# Patient Record
Sex: Female | Born: 2000 | Race: White | Hispanic: No | Marital: Married | Smoking: Never smoker
Health system: Southern US, Community
[De-identification: ages and names within clinical notes are randomized; demographics above are authoritative.]

## PROBLEM LIST (undated history)

## (undated) ENCOUNTER — Inpatient Hospital Stay: Payer: Self-pay

## (undated) DIAGNOSIS — J45909 Unspecified asthma, uncomplicated: Secondary | ICD-10-CM

## (undated) DIAGNOSIS — R51 Headache: Secondary | ICD-10-CM

## (undated) DIAGNOSIS — R519 Headache, unspecified: Secondary | ICD-10-CM

## (undated) HISTORY — PX: TONSILLECTOMY: SUR1361

---
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
## 2019-09-09 ENCOUNTER — Other Ambulatory Visit: Payer: Self-pay

## 2019-09-09 ENCOUNTER — Emergency Department
Admission: EM | Admit: 2019-09-09 | Discharge: 2019-09-09 | Disposition: A | Discharge status: Home / Self Care | Payer: Medicaid Other | Attending: Emergency Medicine | Admitting: Emergency Medicine

## 2019-09-09 ENCOUNTER — Encounter: Payer: Self-pay | Admitting: Emergency Medicine

## 2019-09-09 DIAGNOSIS — R21 Rash and other nonspecific skin eruption: Secondary | ICD-10-CM | POA: Diagnosis present

## 2019-09-09 DIAGNOSIS — L309 Dermatitis, unspecified: Secondary | ICD-10-CM | POA: Diagnosis not present

## 2019-09-09 MED ORDER — EUCERIN EX CREA: TOPICAL_CREAM | CUTANEOUS | 0 refills | Status: DC | PRN

## 2019-09-09 NOTE — ED Provider Notes (Signed)
[facility] Emergency Department Provider Note  ____________________________________________  Time seen: Approximately 10:27 PM  I have reviewed the triage vital signs and the nursing notes.   HISTORY  Chief Complaint Rash    HPI Diane Watson is a 19 y.o. female who presents the emergency department complaining of dry burning rash to the dorsal aspect of the right hand.  She states that it is a dry cracking type rash.  Patient tried using lotion but states that it burns when she applies lotion.  She states that she has been washing her hands and using hand sanitizer at work quite frequently.  No history of recurring skin lesions.  Area is localized to the dorsal aspect of both hands.  No palmar involvement.  Patient has no other symptoms of fevers or chills, URI complaints.  No lesions inside the mouth.  No lesions to the foot.         Past Medical History:  Diagnosis Date  . Asthma   . Headache    Migraines since age 36    Patient Active Problem List   Diagnosis Date Noted  . Hypertension, postpartum condition or complication 08/13/2018  . Encounter for induction of labor 08/08/2018  . Failure to progress in labor, delivered, current hospitalization 08/08/2018  . Delivery of pregnancy by cesarean section 08/08/2018  . Postpartum care following cesarean delivery 08/08/2018  . Severe preeclampsia 08/05/2018  . Headache in pregnancy, antepartum, third trimester 07/07/2018  . Encounter for supervision of normal pregnancy in teen primigravida, antepartum 03/27/2018  . Migraine with aura and without status migrainosus, not intractable 03/20/2018    Past Surgical History:  Procedure Laterality Date  . ADENOIDECTOMY  12/09/2014   Procedure: ADENOIDECTOMY;  Surgeon: Geanie Logan, MD;  Location: [facility];  Service: ENT;;  . CESAREAN SECTION N/A 08/06/2018   Procedure: CESAREAN SECTION;  Surgeon: Conard Novak, MD;  Location: [facility];   Service: Obstetrics;  Laterality: N/A;  . TONSILLECTOMY N/A 12/09/2014   Procedure: TONSILLECTOMY;  Surgeon: Geanie Logan, MD;  Location: [facility];  Service: ENT;  Laterality: N/A;  . TONSILLECTOMY      Prior to Admission medications   Medication Sig Start Date End Date Taking? Authorizing Provider  albuterol (PROVENTIL HFA;VENTOLIN HFA) 108 (90 BASE) MCG/ACT inhaler Inhale into the lungs every 6 (six) hours as needed for wheezing or shortness of breath.    [provider]  butalbital-acetaminophen-caffeine (FIORICET, ESGIC) [phone] MG tablet Take 2 tablets by mouth every 6 (six) hours as needed for headache. 07/07/18   Tresea Mall, CNM  cetirizine (ZYRTEC) 10 MG tablet Take 10 mg by mouth daily. AM    [provider]  ibuprofen (ADVIL,MOTRIN) 800 MG tablet Take 1 tablet (800 mg total) by mouth every 8 (eight) hours. 08/08/18   Farrel Conners, CNM  labetalol (NORMODYNE) 200 MG tablet Take 1 tablet (200 mg total) by mouth 2 (two) times daily. 08/13/18   Nadara Mustard, MD  montelukast (SINGULAIR) 5 MG chewable tablet Chew 5 mg by mouth daily. AM    [provider]  Prenatal Vit-Fe Fumarate-FA (PRENATAL VITAMINS) 28-0.8 MG TABS Take 1 Units by mouth daily. 06/05/18   Oswaldo Conroy, CNM  Skin Protectants, Misc. (EUCERIN) cream Apply topically as needed for dry skin. 09/09/19   Echo Propp, Delorise Royals, PA-C    Allergies Latex, Other, and Pineapple  Family History  Problem Relation Age of Onset  . Diabetes Father  TYPE 1  . Hypertension Father   . Stroke Father   . Migraines Paternal Grandmother     Social History Social History   Tobacco Use  . Smoking status: Never Smoker  . Smokeless tobacco: Never Used  Substance Use Topics  . Alcohol use: No  . Drug use: Never     Review of Systems  Constitutional: No fever/chills Eyes: No visual changes. No discharge ENT: No upper respiratory complaints. Cardiovascular: no chest  pain. Respiratory: no cough. No SOB. Gastrointestinal: No abdominal pain.  No nausea, no vomiting.  No diarrhea.  No constipation. Musculoskeletal: Negative for musculoskeletal pain. Skin: Dry scaly rash to the dorsal aspect of both hands Neurological: Negative for headaches, focal weakness or numbness. 10-point ROS otherwise negative.  ____________________________________________   PHYSICAL EXAM:  VITAL SIGNS: ED Triage Vitals  Enc Vitals Group     BP 09/09/19 2216 (!) 141/85     Pulse Rate 09/09/19 2216 96     Resp 09/09/19 2216 20     Temp 09/09/19 2216 98.5 F (36.9 C)     Temp Source 09/09/19 2216 Oral     SpO2 09/09/19 2216 100 %     Weight 09/09/19 2214 240 lb (108.9 kg)     Height 09/09/19 2214 5\' 4"  (1.626 m)     Head Circumference --      Peak Flow --      Pain Score 09/09/19 2214 6     Pain Loc --      Pain Edu? --      Excl. in GC? --      Constitutional: Alert and oriented. Well appearing and in no acute distress. Eyes: Conjunctivae are normal. PERRL. EOMI. Head: Atraumatic. ENT:      Ears:       Nose: No congestion/rhinnorhea.      Mouth/Throat: Mucous membranes are moist.  No intraoral lesions.  No erythema or edema of the oropharynx. Neck: No stridor.    Cardiovascular: Normal rate, regular rhythm. Normal S1 and S2.  Good peripheral circulation. Respiratory: Normal respiratory effort without tachypnea or retractions. Lungs CTAB. Good air entry to the bases with no decreased or absent breath sounds. Musculoskeletal: Full range of motion to all extremities. No gross deformities appreciated. Neurologic:  Normal speech and language. No gross focal neurologic deficits are appreciated.  Skin:  Skin is warm, dry and intact.  Visualization of the bilateral dorsal hands reveals a scaly, dry, slightly erythematous rash consistent with dyshidrotic eczema.  No evidence of cellulitis.  No evidence of fungal infection.  No lesions to the palms, soles of the  foot. Psychiatric: Mood and affect are normal. Speech and behavior are normal. Patient exhibits appropriate insight and judgement.   ____________________________________________   LABS (all labs ordered are listed, but only abnormal results are displayed)  Labs Reviewed - No data to display ____________________________________________  EKG   ____________________________________________  RADIOLOGY   No results found.  ____________________________________________    PROCEDURES  Procedure(s) performed:    Procedures    Medications - No data to display   ____________________________________________   INITIAL IMPRESSION / ASSESSMENT AND PLAN / ED COURSE  Pertinent labs & imaging results that were available during my care of the patient were reviewed by me and considered in my medical decision making (see chart for details).  Review of the [place] CSRS was performed in accordance of the [facility] prior to dispensing any controlled drugs.  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  Patient presented to emergency department complaining of a dry rash to the dorsal aspect of bilateral hands.  Patient states that she washes her hands frequently using both alcohol-based hand sanitizer as well as soap and water.  On exam findings appear most consistent with dyshidrotic eczema.  Differential included scarlatina, viral exanthem, hand-foot-and-mouth disease, syphilis, atopic dermatitis, allergic dermatitis.  No new foods, medications, topicals.  Patient has no viral URI symptoms.  No lesions elsewhere.  Rash does not have the appearance of a syphilis type rash.  Appearance, symptoms, presentation and location are most consistent with dyshidrotic eczema.. Patient will be discharged home with prescriptions for Eucerin. Patient is to follow up with primary care as needed or otherwise directed. Patient is given ED precautions to return to the ED for any worsening or  new symptoms.     ____________________________________________  FINAL CLINICAL IMPRESSION(S) / ED DIAGNOSES  Final diagnoses:  Eczema, unspecified type      NEW MEDICATIONS STARTED DURING THIS VISIT:  ED Discharge Orders         Ordered    Skin Protectants, Misc. (EUCERIN) cream  As needed     09/09/19 2244              This chart was dictated using voice recognition software/Dragon. Despite best efforts to proofread, errors can occur which can change the meaning. Any change was purely unintentional.    Darletta Moll, PA-C 09/09/19 2316    Nance Pear, MD 09/09/19 2317

## 2019-09-09 NOTE — ED Triage Notes (Signed)
Pt presents to ED with red dry burning rash to her hands about a week ago. No improvement with lotion and increasing hand washing. Redness noted at this time.

## 2022-03-18 ENCOUNTER — Emergency Department: Payer: Medicaid Other

## 2022-03-18 ENCOUNTER — Emergency Department
Admission: EM | Admit: 2022-03-18 | Discharge: 2022-03-18 | Disposition: A | Discharge status: Home / Self Care | Payer: Medicaid Other | Attending: Emergency Medicine | Admitting: Emergency Medicine

## 2022-03-18 ENCOUNTER — Other Ambulatory Visit: Payer: Self-pay

## 2022-03-18 ENCOUNTER — Encounter: Payer: Self-pay | Admitting: Emergency Medicine

## 2022-03-18 DIAGNOSIS — R0609 Other forms of dyspnea: Secondary | ICD-10-CM | POA: Insufficient documentation

## 2022-03-18 DIAGNOSIS — D72829 Elevated white blood cell count, unspecified: Secondary | ICD-10-CM | POA: Diagnosis not present

## 2022-03-18 DIAGNOSIS — R55 Syncope and collapse: Secondary | ICD-10-CM | POA: Diagnosis present

## 2022-03-18 LAB — CBG MONITORING, ED: Glucose-Capillary: 97 mg/dL (ref 70–99)

## 2022-03-18 LAB — POC URINE PREG, ED: Preg Test, Ur: NEGATIVE

## 2022-03-18 LAB — URINALYSIS, ROUTINE W REFLEX MICROSCOPIC
Glucose, UA: NEGATIVE mg/dL
Hgb urine dipstick: NEGATIVE
Leukocytes,Ua: NEGATIVE
Nitrite: NEGATIVE
Protein, ur: 100 mg/dL — AB
Specific Gravity, Urine: 1.02 (ref 1.005–1.030)
pH: 7.5 (ref 5.0–8.0)

## 2022-03-18 LAB — HEPATIC FUNCTION PANEL
ALT: 12 U/L (ref 0–44)
AST: 21 U/L (ref 15–41)
Albumin: 4.6 g/dL (ref 3.5–5.0)
Alkaline Phosphatase: 64 U/L (ref 38–126)
Bilirubin, Direct: 0.2 mg/dL (ref 0.0–0.2)
Indirect Bilirubin: 0.7 mg/dL (ref 0.3–0.9)
Total Bilirubin: 0.9 mg/dL (ref 0.3–1.2)
Total Protein: 7.5 g/dL (ref 6.5–8.1)

## 2022-03-18 LAB — BASIC METABOLIC PANEL
Anion gap: 6 (ref 5–15)
BUN: 10 mg/dL (ref 6–20)
CO2: 26 mmol/L (ref 22–32)
Calcium: 9.2 mg/dL (ref 8.9–10.3)
Chloride: 107 mmol/L (ref 98–111)
Creatinine, Ser: 0.88 mg/dL (ref 0.44–1.00)
GFR, Estimated: >60 mL/min (ref >60)
Glucose, Bld: 103 mg/dL — ABNORMAL HIGH (ref 70–99)
Potassium: 3.7 mmol/L (ref 3.5–5.1)
Sodium: 139 mmol/L (ref 135–145)

## 2022-03-18 LAB — CBC
HCT: 44.3 % (ref 36.0–46.0)
Hemoglobin: 15.1 g/dL — ABNORMAL HIGH (ref 12.0–15.0)
MCH: 30.5 pg (ref 26.0–34.0)
MCHC: 34.1 g/dL (ref 30.0–36.0)
MCV: 89.5 fL (ref 80.0–100.0)
Platelets: 256 10*3/uL (ref 150–400)
RBC: 4.95 MIL/uL (ref 3.87–5.11)
RDW: 12.3 % (ref 11.5–15.5)
WBC: 11.3 10*3/uL — ABNORMAL HIGH (ref 4.0–10.5)
nRBC: 0 % (ref 0.0–0.2)

## 2022-03-18 LAB — URINALYSIS, MICROSCOPIC (REFLEX)

## 2022-03-18 NOTE — Discharge Instructions (Addendum)
I am not sure why you passed out.  Please return if you have any further problems.  I would like you to follow-up with primary care.  You could try the [facility] clinic or the [facility] or the [facility] or [facility] or [facility].  They will take people with insurance.  Clinics that do not take insurance which may take you with Medicaid but they may not either, include the [facility] and the [facility] in the [facility].  [facility] may take you or may just have you follow-up with their primary care people.

## 2022-03-18 NOTE — ED Triage Notes (Signed)
Pt presents to ER accompanied by husband who reports he found pt on the floor, pt loss consciousness reports she does not recall passing out, unsure if she hit her head or not. Pt talks in complete sentences no distress noted

## 2022-03-18 NOTE — ED Provider Notes (Signed)
[facility] Provider Note    Event Date/Time   First MD Initiated Contact with Patient 03/18/22 2057     (approximate)   History   Loss of Consciousness   HPI  Junella Domke is a 21 y.o. female who comes in with her husband.  They report she was sitting and she says she got dizzy everything began spinning and very quickly she just passed out.  She slumped over to the side per her husband.  She did not hit the floor.  He heard some heavy breathing came and found her stood her up and she woke up.  She has never done this before.  She has no allergies to medicines.  She has no medical problems.  She takes no medicines.  Review of her old records shows she has migraines without aura and had severe preeclampsia and a C-section.  Old records also mention hypertension postpartum.   Husband reports she when she is woke up when he stood her up she was confused for few seconds only and then woke up normally.  Again only for a few seconds.   Physical Exam   Triage Vital Signs: ED Triage Vitals  Enc Vitals Group     BP 03/18/22 1956 136/84     Pulse Rate 03/18/22 1956 69     Resp 03/18/22 1956 16     Temp 03/18/22 1956 99 F (37.2 C)     Temp Source 03/18/22 1956 Oral     SpO2 03/18/22 1956 99 %     Weight 03/18/22 1957 181 lb (82.1 kg)     Height 03/18/22 1957 5\' 2"  (1.575 m)     Head Circumference --      Peak Flow --      Pain Score 03/18/22 1957 6     Pain Loc --      Pain Edu? --      Excl. in GC? --     Most recent vital signs: Vitals:   03/18/22 2200 03/18/22 2215  BP:  128/70  Pulse: (!) 37 78  Resp: 17 13  Temp:    SpO2: 98% 98%     General: Awake, no distress.  Head normocephalic atraumatic CV:  Good peripheral perfusion.  Heart regular rate and rhythm no audible murmurs Resp:  Normal effort.  Lungs are clear Abd:  No distention.  Soft and nontender Extremities: No edema    ED Results / Procedures / Treatments   Labs (all  labs ordered are listed, but only abnormal results are displayed) Labs Reviewed  BASIC METABOLIC PANEL - Abnormal; Notable for the following components:      Result Value   Glucose, Bld 103 (*)    All other components within normal limits  CBC - Abnormal; Notable for the following components:   WBC 11.3 (*)    Hemoglobin 15.1 (*)    All other components within normal limits  URINALYSIS, ROUTINE W REFLEX MICROSCOPIC - Abnormal; Notable for the following components:   Bilirubin Urine SMALL (*)    Ketones, ur TRACE (*)    Protein, ur 100 (*)    All other components within normal limits  URINALYSIS, MICROSCOPIC (REFLEX) - Abnormal; Notable for the following components:   Bacteria, UA RARE (*)    All other components within normal limits  HEPATIC FUNCTION PANEL  CBG MONITORING, ED  POC URINE PREG, ED     EKG  EKG read interpreted by me shows normal sinus rhythm rate  of 69 normal axis no acute ST-T changes   RADIOLOGY CT of the head read by Rady allergy reviewed and interpreted by me shows no acute changes   PROCEDURES:  Critical Care performed:   Procedures   MEDICATIONS ORDERED IN ED: Medications - No data to display   IMPRESSION / MDM / [place] / ED COURSE  I reviewed the triage vital signs and the nursing notes. ----------------------------------------- 10:24 PM on 03/18/2022 ----------------------------------------- Patient had only very brief episode of vertigo before she passed out.  She did not have any confusion like postictal state when she woke up she was not out for very long.  EKG CT pregnancy test are all negative urine does not show any acute pathology there is some protein in the urine but that said.  Her white count is minimally elevated at 11.3 normal electrolytes LFTs etc. I cannot find any obvious cause for what happened to her.  It is possible she just fainted for some reason.  I will have her follow-up with primary care and return for any  further problems or symptoms.  I discussed this in detail with her.  She has Medicaid.  So I will give her both a list of the clinics that take insurance and the clinics that do not.  Differential diagnosis includes, but is not limited to, syncope or seizure or TIA are all possible and also very unlikely except for the syncope.  Patient's presentation is most consistent with acute complicated illness / injury requiring diagnostic workup.     FINAL CLINICAL IMPRESSION(S) / ED DIAGNOSES   Final diagnoses:  Syncope and collapse     Rx / DC Orders   ED Discharge Orders     None        Note:  This document was prepared using Dragon voice recognition software and may include unintentional dictation errors.   Nena Polio, MD 03/18/22 2228

## 2022-03-18 NOTE — ED Notes (Signed)
Pt received discharge papers, verbalizes understanding. Pt ambulates to private vehicle accompanied by husband.

## 2022-09-28 ENCOUNTER — Emergency Department
Admission: EM | Admit: 2022-09-28 | Discharge: 2022-09-28 | Disposition: A | Discharge status: Home / Self Care | Payer: Medicaid Other | Attending: Emergency Medicine | Admitting: Emergency Medicine

## 2022-09-28 ENCOUNTER — Emergency Department: Payer: Medicaid Other

## 2022-09-28 ENCOUNTER — Other Ambulatory Visit: Payer: Self-pay

## 2022-09-28 DIAGNOSIS — N939 Abnormal uterine and vaginal bleeding, unspecified: Secondary | ICD-10-CM | POA: Diagnosis not present

## 2022-09-28 LAB — URINALYSIS, ROUTINE W REFLEX MICROSCOPIC
Bacteria, UA: NONE SEEN
Bilirubin Urine: NEGATIVE
Glucose, UA: NEGATIVE mg/dL
Ketones, ur: NEGATIVE mg/dL
Nitrite: NEGATIVE
Protein, ur: NEGATIVE mg/dL
Specific Gravity, Urine: 1.004 — ABNORMAL LOW (ref 1.005–1.030)
pH: 8 (ref 5.0–8.0)

## 2022-09-28 LAB — CBC
HCT: 44.4 % (ref 36.0–46.0)
Hemoglobin: 15.2 g/dL — ABNORMAL HIGH (ref 12.0–15.0)
MCH: 31.3 pg (ref 26.0–34.0)
MCHC: 34.2 g/dL (ref 30.0–36.0)
MCV: 91.4 fL (ref 80.0–100.0)
Platelets: 244 10*3/uL (ref 150–400)
RBC: 4.86 MIL/uL (ref 3.87–5.11)
RDW: 12.8 % (ref 11.5–15.5)
WBC: 10.7 10*3/uL — ABNORMAL HIGH (ref 4.0–10.5)
nRBC: 0 % (ref 0.0–0.2)

## 2022-09-28 LAB — WET PREP, GENITAL
Clue Cells Wet Prep HPF POC: NONE SEEN
Sperm: NONE SEEN
Trich, Wet Prep: NONE SEEN
WBC, Wet Prep HPF POC: <10 (ref <10)
Yeast Wet Prep HPF POC: NONE SEEN

## 2022-09-28 LAB — POC URINE PREG, ED: Preg Test, Ur: NEGATIVE

## 2022-09-28 LAB — CHLAMYDIA/NGC RT PCR (ARMC ONLY)
Chlamydia Tr: NOT DETECTED
N gonorrhoeae: NOT DETECTED

## 2022-09-28 LAB — TSH: TSH: 0.946 u[IU]/mL (ref 0.350–4.500)

## 2022-09-28 MED ORDER — LEVONORGESTREL-ETHINYL ESTRAD 0.1-20 MG-MCG PO TABS: 1.0000 | ORAL_TABLET | Freq: Every day | ORAL | 2 refills | Status: DC

## 2022-09-28 NOTE — ED Triage Notes (Signed)
Pt states coming in with vaginal bleeding for 2 weeks. Pt states she started her menstrual cycle and it has not ended. Pt states pain to the genital area. Pt states she is having blood clots as well.

## 2022-09-28 NOTE — Discharge Instructions (Signed)
Your exam and labs overall reassuring at this time.  No signs of anemia or acute vaginitis.  Take the prescription birth control pill as directed.  Select and follow-up with a local GYN provider for ongoing evaluation.

## 2022-09-28 NOTE — ED Provider Notes (Signed)
[facility] Emergency Department Provider Note     Event Date/Time   First MD Initiated Contact with Patient 09/28/22 1348     (approximate)   History   Vaginal Bleeding   HPI  Diane Watson is a 22 y.o. female G1P1, to the ED with abnormal menstrual bleeding.  Patient reports vaginal bleeding started about 2 weeks ago, consistent with what she thought was her normal menstrual period.  She describes continuing vaginal bleeding with passage of large clots at this time.  She denies any history of ovarian cyst, endometriosis, fibroids, or any history of AUB.  Patient not on any current hormone therapy.  She any fevers, chills, or sweats.  She also denies any dysuria or hematuria.  Physical Exam   Triage Vital Signs: ED Triage Vitals  Enc Vitals Group     BP 09/28/22 1230 (!) 137/95     Pulse Rate 09/28/22 1230 81     Resp 09/28/22 1230 16     Temp 09/28/22 1230 98.4 F (36.9 C)     Temp Source 09/28/22 1230 Oral     SpO2 09/28/22 1230 100 %     Weight 09/28/22 1231 190 lb (86.2 kg)     Height 09/28/22 1231 5\' 2"  (1.575 m)     Head Circumference --      Peak Flow --      Pain Score 09/28/22 1230 8     Pain Loc --      Pain Edu? --      Excl. in [place]? --     Most recent vital signs: Vitals:   09/28/22 1230 09/28/22 1650  BP: (!) 137/95 128/76  Pulse: 81 70  Resp: 16 18  Temp: 98.4 F (36.9 C)   SpO2: 100% 100%    General Awake, no distress. NAD CV:  Good peripheral perfusion.  RESP:  Normal effort.  ABD:  No distention.  GU:  Normal external genitalia.  Scant dark blood noted in the vault.  Cervix is closed.  No CMT or adnexal masses appreciated.   ED Results / Procedures / Treatments   Labs (all labs ordered are listed, but only abnormal results are displayed) Labs Reviewed  CBC - Abnormal; Notable for the following components:      Result Value   WBC 10.7 (*)    Hemoglobin 15.2 (*)    All other components within normal  limits  URINALYSIS, ROUTINE W REFLEX MICROSCOPIC - Abnormal; Notable for the following components:   Color, Urine STRAW (*)    APPearance CLEAR (*)    Specific Gravity, Urine 1.004 (*)    Hgb urine dipstick LARGE (*)    Leukocytes,Ua TRACE (*)    All other components within normal limits  WET PREP, GENITAL  CHLAMYDIA/NGC RT PCR ([facility] ONLY)            TSH  POC URINE PREG, ED     EKG   RADIOLOGY  I personally viewed and evaluated these images as part of my medical decision making, as well as reviewing the written report by the radiologist.  ED Provider Interpretation: no acute findings  US PELVIC COMPLETE W TRANSVAGINAL AND TORSION R/O  Result Date: 09/28/2022 CLINICAL DATA:  Vaginal bleeding for 2 weeks. EXAM: TRANSABDOMINAL AND TRANSVAGINAL ULTRASOUND OF PELVIS DOPPLER ULTRASOUND OF OVARIES TECHNIQUE: Both transabdominal and transvaginal ultrasound examinations of the pelvis were performed. Transabdominal technique was performed for global imaging of the pelvis including uterus, ovaries, adnexal regions,  and pelvic cul-de-sac. It was necessary to proceed with endovaginal exam following the transabdominal exam to visualize the ovaries and endometrium. Color and duplex Doppler ultrasound was utilized to evaluate blood flow to the ovaries. COMPARISON:  None Available. FINDINGS: Uterus Measurements: 8.9 x 4.6 x 5.1 cm = volume: 109.5 mL. No fibroids or other mass visualized. Endometrium Thickness: 7.4 mm.  No endometrial mass, polyp or fluid collection. Right ovary Measurements: 3.1 x 2.4 x 2.7 cm = volume: 10.65 mL. Normal appearance/no adnexal mass. Left ovary Measurements: 2.4 x 1.5 x 2.1 cm = volume: 4.1 mL. Normal appearance/no adnexal mass. Pulsed Doppler evaluation of both ovaries demonstrates normal low-resistance arterial and venous waveforms. Other findings No abnormal free fluid. IMPRESSION: Normal pelvic ultrasound examination. Electronically Signed   By: Rudie Meyer M.D.   On:  09/28/2022 16:41     PROCEDURES:  Critical Care performed: No  Procedures   MEDICATIONS ORDERED IN ED: Medications - No data to display   IMPRESSION / MDM / ASSESSMENT AND PLAN / ED COURSE  I reviewed the triage vital signs and the nursing notes.                              Differential diagnosis includes, but is not limited to, ovarian cyst, ovarian torsion, acute appendicitis, diverticulitis, urinary tract infection/pyelonephritis, endometriosis, bowel obstruction, colitis, renal colic, gastroenteritis, hernia, fibroids, endometriosis, pregnancy related pain including ectopic pregnancy, etc.  Patient's presentation is most consistent with acute complicated illness / injury requiring diagnostic workup.   Patient's diagnosis is consistent with abnormal uterine bleeding.  Reassuring workup overall without signs of critical anemia acute leukocytosis.  No evidence of UTI, or abnormal ultrasound finding, based on my review and interpretation.  Patient will be discharged home with prescriptions for estrogen containing birth control pill. Patient is to follow up with [facility] as needed or otherwise directed. Patient is given ED precautions to return to the ED for any worsening or new symptoms.   FINAL CLINICAL IMPRESSION(S) / ED DIAGNOSES   Final diagnoses:  Abnormal uterine bleeding (AUB)     Rx / DC Orders   ED Discharge Orders          Ordered    levonorgestrel-ethinyl estradiol (ALESSE) 0.1-20 MG-MCG tablet  Daily        09/28/22 1713             Note:  This document was prepared using Dragon voice recognition software and may include unintentional dictation errors.    Lissa Hoard, PA-C 09/28/22 1811    Sharman Cheek, MD 09/30/22 0030

## 2023-01-26 ENCOUNTER — Ambulatory Visit: Payer: Medicaid Other

## 2023-01-26 VITALS — BP 129/87 | HR 88 | Ht 62.4 in | Wt 209.1 lb

## 2023-01-26 DIAGNOSIS — Z3202 Encounter for pregnancy test, result negative: Secondary | ICD-10-CM

## 2023-01-26 DIAGNOSIS — N912 Amenorrhea, unspecified: Secondary | ICD-10-CM | POA: Diagnosis not present

## 2023-01-26 LAB — POCT URINE PREGNANCY: Preg Test, Ur: NEGATIVE

## 2023-01-26 NOTE — Progress Notes (Signed)
   GYN ENCOUNTER  Encounter for pregnancy confirmation  Subjective  HPI: Diane Watson is a 22 y.o. G1P0101 who presents today for confirmation of pregnancy. Her LMP was 12/30/22. She reports having regular periods every 28-30 days except for this month. She has been actively trying to get pregnant for about 4 months. She took a [facility] pregnancy test about a week ago that was positive.   Past Medical History:  Diagnosis Date   Asthma    Encounter for supervision of normal pregnancy in teen primigravida, antepartum 03/27/2018   Clinic    [facility]    Prenatal Labs      Dating    17 w ultrasound    Blood type: O/Positive/-- (10/03 0959)       Genetic Screen    NIPS: Negative XY    Antibody:Negative (10/03 0959)      Anatomic [place]    Complete 05/09/2018    Rubella: 5.98 (10/03 0959) Varicella: Non-immune      GTT    Early: 119             Third trimester: 133    RPR: Non Reactive (10/03 0959)       Rhogam    N/A    HBsAg: Neg   Failure to progress in labor, delivered, current hospitalization 08/08/2018   Headache    Migraines since age 82   Hypertension, postpartum condition or complication 08/13/2018   Past Surgical History:  Procedure Laterality Date   ADENOIDECTOMY  12/09/2014   Procedure: ADENOIDECTOMY;  Surgeon: Geanie Logan, MD;  Location: [facility];  Service: ENT;;   CESAREAN SECTION N/A 08/06/2018   Procedure: CESAREAN SECTION;  Surgeon: Conard Novak, MD;  Location: [facility] OR[facility];  Service: Obstetrics;  Laterality: N/A;   TONSILLECTOMY N/A 12/09/2014   Procedure: TONSILLECTOMY;  Surgeon: Geanie Logan, MD;  Location: [facility];  Service: ENT;  Laterality: N/A;   TONSILLECTOMY     OB History     Gravida  1   Para  1   Term      Preterm  1   AB      Living  1      SAB      IAB      Ectopic      Multiple  0   Live Births  1          Allergies  Allergen Reactions   Latex    Other    Pineapple     Numbness in mouth     Review  of Systems  12 point ROS negative except for pertinent positives noted in HPO above.   Objective  BP 129/87   Pulse 88   Ht 5' 2.4" (1.585 m)   Wt 209 lb 1.6 oz (94.8 kg)   LMP 12/30/2022 (Approximate)   BMI 37.76 kg/m   Physical examination GENERAL APPEARANCE: alert, well appearing    Assessment - Negative urine pregnancy test in office today. - Possible early pregnancy or late menstrual cycle.  Plan - bHcg ordered today, will repeat after weekend to confirm levels are rising appropriately. - Counseled to make NOB and ultrasound appointment if lab work confirms pregnancy.   [name], CNM  01/26/23 9:39 AM

## 2023-01-30 ENCOUNTER — Other Ambulatory Visit: Payer: Medicaid Other

## 2023-02-01 ENCOUNTER — Other Ambulatory Visit: Payer: Medicaid Other

## 2023-02-02 ENCOUNTER — Telehealth: Payer: Self-pay

## 2023-02-02 NOTE — Telephone Encounter (Signed)
Reached out to pt to reschedule lab appt that was scheduled on 02/01/2023 at 11:00.  Left message for pt to call back to reschedule.

## 2023-02-06 NOTE — Telephone Encounter (Signed)
Reached out to pt (2x) to reschedule lab appt that was scheuduled on 02/01/2023 at 11:00.  Left message for pt to call back to reschedule.   Will send a MyChart letter.

## 2023-10-19 ENCOUNTER — Ambulatory Visit

## 2023-10-19 VITALS — BP 130/83 | HR 91 | Wt 218.0 lb

## 2023-10-19 DIAGNOSIS — Z3201 Encounter for pregnancy test, result positive: Secondary | ICD-10-CM | POA: Diagnosis not present

## 2023-10-19 DIAGNOSIS — N912 Amenorrhea, unspecified: Secondary | ICD-10-CM

## 2023-10-19 LAB — POCT URINE PREGNANCY: Preg Test, Ur: POSITIVE — AB

## 2023-10-19 NOTE — Progress Notes (Signed)
    NURSE VISIT NOTE  Subjective:    Patient ID: Maisley Hainsworth, female    DOB: 08/03/2000, 23 y.o.   MRN: [id number]  HPI  Patient is a 23 y.o. G66P0101 female who presents for evaluation of amenorrhea. She believes she could be pregnant. Pregnancy is desired. Sexual Activity: single partner, contraception: none. Current symptoms also include: breast tenderness, fatigue, and positive home pregnancy test. Last period was normal.    Objective:    BP 130/83 (BP Location: Right Arm, Patient Position: Sitting, Cuff Size: Large)   Pulse 91   Wt 218 lb (98.9 kg)   LMP 08/07/2023 (Approximate)   BMI 39.36 kg/m     Assessment:   1. Amenorrhea     Plan:   Pregnancy Test: Positive  Estimated Date of Delivery: 05/13/2024 BP Cuff Measurement taken. Cuff Size Adult Large     Cathyann Cobia, CMA

## 2023-10-23 ENCOUNTER — Telehealth (INDEPENDENT_AMBULATORY_CARE_PROVIDER_SITE_OTHER)

## 2023-10-23 VITALS — Ht 62.4 in | Wt 215.0 lb

## 2023-10-23 DIAGNOSIS — O0990 Supervision of high risk pregnancy, unspecified, unspecified trimester: Secondary | ICD-10-CM | POA: Insufficient documentation

## 2023-10-23 DIAGNOSIS — O0991 Supervision of high risk pregnancy, unspecified, first trimester: Secondary | ICD-10-CM

## 2023-10-23 DIAGNOSIS — Z98891 History of uterine scar from previous surgery: Secondary | ICD-10-CM

## 2023-10-23 DIAGNOSIS — Z8759 Personal history of other complications of pregnancy, childbirth and the puerperium: Secondary | ICD-10-CM

## 2023-10-23 MED ORDER — BLOOD PRESSURE KIT: PACK | 0 refills | Status: DC

## 2023-10-23 NOTE — Patient Instructions (Signed)
 Prenatal Classes offered through [facility] [email or web address]   Doula Website [email or web address]  First Trimester of Pregnancy  The first trimester of pregnancy starts on the first day of your last monthly period until the end of week 13. This is months 1 through 3 of pregnancy. A week after a sperm fertilizes an egg, the egg will implant into the wall of the uterus and begin to develop into a baby. Body changes during your first trimester Your body goes through many changes during pregnancy. The changes usually return to normal after your baby is born. Physical changes Your breasts may grow larger and may hurt. The area around your nipples may get darker. Your periods will stop. Your hair and nails may grow faster. You may pee more often. Health changes You may tire easily. Your gums may bleed and may be sensitive when you brush and floss. You may not feel hungry. You may have heartburn. You may throw up or feel like you may throw up. You may want to eat some foods, but not others. You may have headaches. You may have trouble pooping (constipation). Other changes Your emotions may change from day to day. You may have more dreams. Follow these instructions at home: Medicines Talk to your health care provider if you're taking medicines. Ask if the medicines are safe to take during pregnancy. Your provider may change the medicines that you take. Do not take any medicines unless told to by your provider. Take a prenatal vitamin that has at least 600 micrograms (mcg) of folic acid. Do not use herbal medicines, illegal substances, or medicines that are not approved by your provider. Eating and drinking While you're pregnant your body needs extra food for your growing baby. Talk with your provider about what to eat while pregnant. Activity Most women are able  to exercise during pregnancy. Exercises may need to change as your pregnancy goes on. Talk to your provider about your activities and exercise routines. Relieving pain and discomfort Wear a good, supportive bra if your breasts hurt. Rest with your legs raised if you have leg cramps or low back pain. Safety Wear your seatbelt at all times when you're in a car. Talk to your provider if someone hits you, hurts you, or yells at you. Talk with your provider if you're feeling sad or have thoughts of hurting yourself. Lifestyle Certain things can be harmful while you're pregnant. Follow these rules: Do not use hot tubs, steam rooms, or saunas. Do not douche. Do not use tampons or scented pads. Do not drink alcohol,smoke, vape, or use products with nicotine or tobacco in them. If you need help quitting, talk with your provider. Avoid cat litter boxes and soil used by cats. These things carry germs that can cause harm to your pregnancy and your baby. General instructions Keep all follow-up visits. It helps you and your unborn baby stay as healthy as possible. Write down your questions. Take them to your visits. Your provider will: Talk with you about your overall health. Give you advice or refer you to specialists who can help with different needs, including: Prenatal education classes. Mental health and counseling. Foods and healthy eating. Ask for help if you need help with food. Call your dentist and ask to be seen. Brush your teeth with a soft toothbrush. Floss gently. Where to find more information American Pregnancy Association: americanpregnancy.org [employer]: acog.org Office on [employer] Health: TravelLesson.ca Contact a health care provider if: You feel dizzy, faint, or  have a fever. You vomit or have watery poop (diarrhea) for 2 days or more. You have abnormal discharge or bleeding from your vagina. You have pain when you pee or your pee smells  bad. You have cramps, pain, or pressure in your belly area. Get help right away if: You have trouble breathing or chest pain. You have any kind of injury, such as from a fall or a car crash. These symptoms may be an emergency. Get help right away. Call 911. Do not wait to see if the symptoms will go away. Do not drive yourself to the hospital. This information is not intended to replace advice given to you by your health care provider. Make sure you discuss any questions you have with your health care provider. Document Revised: 03/16/2023 Document Reviewed: 10/14/2022 Elsevier Patient Education  2024 Elsevier Inc.Commonly Asked Questions During Pregnancy  Cats: A parasite can be excreted in cat feces.  To avoid exposure you need to have another person empty the little box.  If you must empty the litter box you will need to wear gloves.  Wash your hands after handling your cat.  This parasite can also be found in raw or undercooked meat so this should also be avoided.  Colds, Sore Throats, Flu: Please check your medication sheet to see what you can take for symptoms.  If your symptoms are unrelieved by these medications please call the office.  Dental Work: Most any dental work Agricultural consultant recommends is permitted.  X-rays should only be taken during the first trimester if absolutely necessary.  Your abdomen should be shielded with a lead apron during all x-rays.  Please notify your provider prior to receiving any x-rays.  Novocaine is fine; gas is not recommended.  If your dentist requires a note from [place] prior to dental work please call the office and we will provide one for you.  Exercise: Exercise is an important part of staying healthy during your pregnancy.  You may continue most exercises you were accustomed to prior to pregnancy.  Later in your pregnancy you will most likely notice you have difficulty with activities requiring balance like riding a bicycle.  It is important that you listen  to your body and avoid activities that put you at a higher risk of falling.  Adequate rest and staying well hydrated are a must!  If you have questions about the safety of specific activities ask your provider.    Exposure to Children with illness: Try to avoid obvious exposure; report any symptoms to [place] when noted,  If you have chicken pos, red measles or mumps, you should be immune to these diseases.   Please do not take any vaccines while pregnant unless you have checked with your OB provider.  Fetal Movement: After 28 weeks we recommend you do "kick counts" twice daily.  Lie or sit down in a calm quiet environment and count your baby movements "kicks".  You should feel your baby at least 10 times per hour.  If you have not felt 10 kicks within the first hour get up, walk around and have something sweet to eat or drink then repeat for an additional hour.  If count remains less than 10 per hour notify your provider.  Fumigating: Follow your pest control agent's advice as to how long to stay out of your home.  Ventilate the area well before re-entering.  Hemorrhoids:   Most over-the-counter preparations can be used during pregnancy.  Check your medication to see  what is safe to use.  It is important to use a stool softener or fiber in your diet and to drink lots of liquids.  If hemorrhoids seem to be getting worse please call the office.   Hot Tubs:  Hot tubs Jacuzzis and saunas are not recommended while pregnant.  These increase your internal body temperature and should be avoided.  Intercourse:  Sexual intercourse is safe during pregnancy as long as you are comfortable, unless otherwise advised by your provider.  Spotting may occur after intercourse; report any bright red bleeding that is heavier than spotting.  Labor:  If you know that you are in labor, please go to the hospital.  If you are unsure, please call the office and let us help you decide what to do.  Lifting, straining, etc:  If your  job requires heavy lifting or straining please check with your provider for any limitations.  Generally, you should not lift items heavier than that you can lift simply with your hands and arms (no back muscles)  Painting:  Paint fumes do not harm your pregnancy, but may make you ill and should be avoided if possible.  Latex or water based paints have less odor than oils.  Use adequate ventilation while painting.  Permanents & Hair Color:  Chemicals in hair dyes are not recommended as they cause increase hair dryness which can increase hair loss during pregnancy.  " Highlighting" and permanents are allowed.  Dye may be absorbed differently and permanents may not hold as well during pregnancy.  Sunbathing:  Use a sunscreen, as skin burns easily during pregnancy.  Drink plenty of fluids; avoid over heating.  Tanning Beds:  Because their possible side effects are still unknown, tanning beds are not recommended.  Ultrasound Scans:  Routine ultrasounds are performed at approximately 20 weeks.  You will be able to see your baby's general anatomy an if you would like to know the gender this can usually be determined as well.  If it is questionable when you conceived you may also receive an ultrasound early in your pregnancy for dating purposes.  Otherwise ultrasound exams are not routinely performed unless there is a medical necessity.  Although you can request a scan we ask that you pay for it when conducted because insurance does not cover " patient request" scans.  Work: If your pregnancy proceeds without complications you may work until your due date, unless your physician or employer advises otherwise.  Round Ligament Pain/Pelvic Discomfort:  Sharp, shooting pains not associated with bleeding are fairly common, usually occurring in the second trimester of pregnancy.  They tend to be worse when standing up or when you remain standing for long periods of time.  These are the result of pressure of certain  pelvic ligaments called "round ligaments".  Rest, Tylenol and heat seem to be the most effective relief.  As the womb and fetus grow, they rise out of the pelvis and the discomfort improves.  Please notify the office if your pain seems different than that described.  It may represent a more serious condition.  Common Medications Safe in Pregnancy  Acne:      Constipation:  Benzoyl Peroxide     Colace  Clindamycin      Dulcolax Suppository  Topica Erythromycin     Fibercon  Salicylic Acid      Metamucil         Miralax AVOID:        Senakot   Accutane  Cough:  Retin-A       Cough Drops  Tetracycline      Phenergan w/ Codeine if Rx  Minocycline      Robitussin (Plain & DM)  Antibiotics:     Crabs/Lice:  Ceclor       RID  Cephalosporins    AVOID:  E-Mycins      Kwell  Keflex  Macrobid/Macrodantin   Diarrhea:  Penicillin      Kao-Pectate  Zithromax      Imodium AD         PUSH FLUIDS AVOID:       Cipro     Fever:  Tetracycline      Tylenol (Regular or Extra  Minocycline       Strength)  Levaquin      Extra Strength-Do not          Exceed 8 tabs/24 hrs Caffeine:        200mg /day (equiv. To 1 cup of coffee or  approx. 3 12 oz sodas)         Gas: Cold/Hayfever:       Gas-X  Benadryl      Mylicon  Claritin       Phazyme  **Claritin-D        Chlor-Trimeton    Headaches:  Dimetapp      ASA-Free Excedrin  Drixoral-Non-Drowsy     Cold Compress  Mucinex (Guaifenasin)     Tylenol (Regular or Extra  Sudafed/Sudafed-12 Hour     Strength)  **Sudafed PE Pseudoephedrine   Tylenol Cold & Sinus     Vicks Vapor Rub  Zyrtec  **AVOID if Problems With Blood Pressure         Heartburn: Avoid lying down for at least 1 hour after meals  Aciphex      Maalox     Rash:  Milk of Magnesia     Benadryl    Mylanta       1% Hydrocortisone Cream  Pepcid  Pepcid Complete   Sleep Aids:  Prevacid      Ambien   Prilosec       Benadryl  Rolaids       Chamomile Tea  Tums (Limit  4/day)     Unisom         Tylenol PM         Warm milk-add vanilla or  Hemorrhoids:       Sugar for taste  Anusol/Anusol H.C.  (RX: Analapram 2.5%)  Sugar Substitutes:  Hydrocortisone OTC     Ok in moderation  Preparation H      Tucks        Vaseline lotion applied to tissue with wiping    Herpes:     Throat:  Acyclovir      Oragel  Famvir  Valtrex     Vaccines:         Flu Shot Leg Cramps:       *Gardasil  Benadryl      Hepatitis A         Hepatitis B Nasal Spray:       Pneumovax  Saline Nasal Spray     Polio Booster         Tetanus Nausea:       Tuberculosis test or PPD  Vitamin B6 25 mg TID   AVOID:    Dramamine      *Gardasil  Emetrol       Live Poliovirus  Ginger Root 250 mg  QID    MMR (measles, mumps &  High Complex Carbs @ Bedtime    rebella)  Sea Bands-Accupressure    Varicella (Chickenpox)  Unisom 1/2 tab TID     *No known complications           If received before Pain:         Known pregnancy;   Darvocet       Resume series after  Lortab        Delivery  Percocet    Yeast:   Tramadol      Femstat  Tylenol 3      Gyne-lotrimin  Ultram       Monistat  Vicodin           MISC:         All Sunscreens           Hair Coloring/highlights          Insect Repellant's          (Including DEET)         Mystic Tans

## 2023-10-23 NOTE — Progress Notes (Signed)
 New OB Intake  I connected with  Diane Watson on 10/23/23 at  2:15 PM EDT by MyChart Video Visit and verified that I am speaking with the correct person using two identifiers. Nurse is located at [facility] and pt is located in her car.  I discussed the limitations, risks, security and privacy concerns of performing an evaluation and management service by telephone and the availability of in person appointments. I also discussed with the patient that there may be a patient responsible charge related to this service. The patient expressed understanding and agreed to proceed.  I explained I am completing New OB Intake today. We discussed her EDD of 05/13/24 that is based on LMP of 08/07/23. Pt is G2/P0101. I reviewed her allergies, medications, Medical/Surgical/OB history, and appropriate screenings. There are cats in the home in the home, No.  Based on history, this is a/an pregnancy complicated by hypertension and pre-eclampsia . Her obstetrical history is significant for obesity, pre-eclampsia, and gestational hypertension .  Patient Active Problem List   Diagnosis Date Noted   Supervision of high risk pregnancy, antepartum 10/23/2023   Delivery of pregnancy by cesarean section 08/08/2018   Severe preeclampsia 08/05/2018   Headache in pregnancy, antepartum, third trimester 07/07/2018   Migraine with aura and without status migrainosus, not intractable 03/20/2018    Concerns addressed today  Delivery Plans:  Plans to deliver at [facility]  Dating US  Explained first scheduled US  will be around 9-10 weeks. Dating US  scheduled for 10/26/23 at 3. Pt notified to arrive at 2:45.  Labs Discussed genetic screening with patient. Patient wants genetic testing to be drawn at new OB visit. Discussed possible labs to be drawn at new OB appointment.  COVID Vaccine Patient has not had COVID vaccine.   Social Determinants of Health Food Insecurity: denies food insecurity WIC  Referral: Patient is interested in referral to [facility]  Transportation: Patient denies transportation needs. Childcare: Discussed no children allowed at ultrasound appointments.   First visit review I reviewed new OB appt with pt. I explained she will have ob bloodwork and pap smear/pelvic exam if indicated. Explained pt will be seen by Josue Nip, CNM at first visit; encounter routed to appropriate provider.   Vale Garrison, CMA 10/23/2023  2:44 PM

## 2023-10-26 ENCOUNTER — Ambulatory Visit
Admission: RE | Admit: 2023-10-26 | Discharge: 2023-10-26 | Disposition: A | Discharge status: Home / Self Care | Source: Ambulatory Visit | Attending: Licensed Practical Nurse | Admitting: Licensed Practical Nurse

## 2023-10-26 DIAGNOSIS — N912 Amenorrhea, unspecified: Secondary | ICD-10-CM | POA: Insufficient documentation

## 2023-11-02 ENCOUNTER — Encounter: Payer: Self-pay | Admitting: Obstetrics

## 2023-11-02 ENCOUNTER — Ambulatory Visit (INDEPENDENT_AMBULATORY_CARE_PROVIDER_SITE_OTHER): Admitting: Obstetrics

## 2023-11-02 ENCOUNTER — Other Ambulatory Visit: Payer: Self-pay

## 2023-11-02 VITALS — BP 113/70 | HR 74 | Ht 62.0 in | Wt 216.8 lb

## 2023-11-02 DIAGNOSIS — Z3A01 Less than 8 weeks gestation of pregnancy: Secondary | ICD-10-CM | POA: Diagnosis not present

## 2023-11-02 DIAGNOSIS — O3680X0 Pregnancy with inconclusive fetal viability, not applicable or unspecified: Secondary | ICD-10-CM

## 2023-11-02 DIAGNOSIS — Z113 Encounter for screening for infections with a predominantly sexual mode of transmission: Secondary | ICD-10-CM

## 2023-11-02 DIAGNOSIS — Z8759 Personal history of other complications of pregnancy, childbirth and the puerperium: Secondary | ICD-10-CM

## 2023-11-02 DIAGNOSIS — Z0283 Encounter for blood-alcohol and blood-drug test: Secondary | ICD-10-CM

## 2023-11-02 DIAGNOSIS — Z1379 Encounter for other screening for genetic and chromosomal anomalies: Secondary | ICD-10-CM

## 2023-11-02 DIAGNOSIS — O0990 Supervision of high risk pregnancy, unspecified, unspecified trimester: Secondary | ICD-10-CM

## 2023-11-02 DIAGNOSIS — O0991 Supervision of high risk pregnancy, unspecified, first trimester: Secondary | ICD-10-CM

## 2023-11-02 DIAGNOSIS — O26893 Other specified pregnancy related conditions, third trimester: Secondary | ICD-10-CM

## 2023-11-02 DIAGNOSIS — R110 Nausea: Secondary | ICD-10-CM

## 2023-11-02 DIAGNOSIS — G43109 Migraine with aura, not intractable, without status migrainosus: Secondary | ICD-10-CM

## 2023-11-02 DIAGNOSIS — O1410 Severe pre-eclampsia, unspecified trimester: Secondary | ICD-10-CM

## 2023-11-02 MED ORDER — ONDANSETRON HCL 4 MG PO TABS: 4.0000 mg | ORAL_TABLET | Freq: Three times a day (TID) | ORAL | 2 refills | Status: DC | PRN

## 2023-11-02 MED ORDER — ONDANSETRON 4 MG PO TBDP: 4.0000 mg | ORAL_TABLET | Freq: Three times a day (TID) | ORAL | 0 refills | Status: DC | PRN

## 2023-11-02 NOTE — Progress Notes (Signed)
 NEW OB HISTORY AND PHYSICAL  SUBJECTIVE:       Diane Watson is a 23 y.o. G41P0101 female, Patient's last menstrual period was 08/07/2023 (approximate)., Estimated Date of Delivery: 06/20/24, [redacted]w[redacted]d, presents today for establishment of Prenatal Care. She reports nausea, fatigue, and breast tenderness. She has a h/o irregular periods and frequently skips a period. She had an US  that has not yet been read by radiologist. Preliminary read shows [redacted]w[redacted]d gestational sac without embryo.  Social history Partner/Relationship:husband, Bambi Lever Living situation: lives with husband and son Work: Conservation officer, nature at [employer] Exercise: walking at work Substance use: none   Gynecologic History Patient's last menstrual period was 08/07/2023 (approximate). Normal H/o irregular periods Contraception: none Last Pap: unsure.   Obstetric History OB History  Gravida Para Term Preterm AB Living  2 1  1  1   SAB IAB Ectopic Multiple Live Births     0 1    # Outcome Date GA Lbr Len/2nd Weight Sex Type Anes PTL Lv  2 Current           1 Preterm 08/06/18 [redacted]w[redacted]d  4 lb 11.8 oz (2.15 kg) M CS-LTranv Spinal, EPI  LIV    Past Medical History:  Diagnosis Date   Asthma    Encounter for supervision of normal pregnancy in teen primigravida, antepartum 03/27/2018   Clinic    [facility]    Prenatal Labs      Dating    17 w ultrasound    Blood type: O/Positive/-- (10/03 0959)       Genetic Screen    NIPS: Negative XY    Antibody:Negative (10/03 0959)      Anatomic US     Complete 05/09/2018    Rubella: 5.98 (10/03 0959) Varicella: Non-immune      GTT    Early: 119             Third trimester: 133    RPR: Non Reactive (10/03 0959)       Rhogam    N/A    HBsAg: Neg   Failure to progress in labor, delivered, current hospitalization 08/08/2018   Headache    Migraines since age 35   Hypertension, postpartum condition or complication 08/13/2018    Past Surgical History:  Procedure Laterality Date   ADENOIDECTOMY  12/09/2014    Procedure: ADENOIDECTOMY;  Surgeon: Von Grumbling, MD;  Location: [facility];  Service: ENT;;   CESAREAN SECTION N/A 08/06/2018   Procedure: CESAREAN SECTION;  Surgeon: Kris Pester, MD;  Location: [facility];  Service: Obstetrics;  Laterality: N/A;   TONSILLECTOMY N/A 12/09/2014   Procedure: TONSILLECTOMY;  Surgeon: Von Grumbling, MD;  Location: [facility];  Service: ENT;  Laterality: N/A;   TONSILLECTOMY      Current Outpatient Medications on File Prior to Visit  Medication Sig Dispense Refill   Blood Pressure KIT Use daily 2-3 times to check BP, report any readings over 140/90 1 kit 0   butalbital -acetaminophen -caffeine  (FIORICET , ESGIC ) 50-325-40 MG tablet Take 2 tablets by mouth every 6 (six) hours as needed for headache. (Patient not taking: Reported on 10/19/2023) 20 tablet 0   Prenatal Vit-Fe Fumarate-FA (PRENATAL VITAMINS) 28-0.8 MG TABS Take 1 Units by mouth daily. 30 tablet 4   No current facility-administered medications on file prior to visit.    Allergies  Allergen Reactions   Latex    Other    Pineapple     Numbness in mouth     Social History   Socioeconomic History  Marital status: Married    Spouse name: Bambi Lever   Number of children: 1   Years of education: Not on file   Highest education level: Not on file  Occupational History   Occupation: UNEMPLOYED  Tobacco Use   Smoking status: Never   Smokeless tobacco: Never  Vaping Use   Vaping status: Former   Quit date: 03/31/2023  Substance and Sexual Activity   Alcohol use: No   Drug use: Never   Sexual activity: Yes  Other Topics Concern   Not on file  Social History Narrative   Not on file   Social Drivers of Health   Financial Resource Strain: Low Risk  (07/07/2018)   Overall Financial Resource Strain (CARDIA)    Difficulty of Paying Living Expenses: Not hard at all  Food Insecurity: No Food Insecurity (10/23/2023)   Hunger Vital Sign    Worried About Running Out of Food in the  Last Year: Never true    Ran Out of Food in the Last Year: Never true  Transportation Needs: No Transportation Needs (10/23/2023)   PRAPARE - Administrator, Civil Service (Medical): No    Lack of Transportation (Non-Medical): No  Physical Activity: Inactive (07/07/2018)   Exercise Vital Sign    Days of Exercise per Week: 0 days    Minutes of Exercise per Session: 0 min  Stress: No Stress Concern Present (07/07/2018)   [employer] of Occupational Health - Occupational Stress Questionnaire    Feeling of Stress : Not at all  Social Connections: Moderately Isolated (07/07/2018)   Social Connection and Isolation Panel [NHANES]    Frequency of Communication with Friends and Family: Never    Frequency of Social Gatherings with Friends and Family: Never    Attends Religious Services: Never    Database administrator or Organizations: No    Attends Banker Meetings: Never    Marital Status: Married  Catering manager Violence: Not At Risk (10/23/2023)   Humiliation, Afraid, Rape, and Kick questionnaire    Fear of Current or Ex-Partner: No    Emotionally Abused: No    Physically Abused: No    Sexually Abused: No    Family History  Problem Relation Age of Onset   Diabetes Father        TYPE 1   Hypertension Father    Stroke Father    Migraines Paternal Grandmother     The following portions of the patient's history were reviewed and updated as appropriate: allergies, current medications, past OB history, past medical history, past surgical history, past family history, past social history, and problem list.  Constitutional: Denied constitutional symptoms, night sweats, recent illness, fatigue, fever, insomnia and weight loss.  Eyes: Denied eye symptoms, eye pain, photophobia, vision change and visual disturbance.  Ears/Nose/Throat/Neck: Denied ear, nose, throat or neck symptoms, hearing loss, nasal discharge, sinus congestion and sore throat.  Cardiovascular:  Denied cardiovascular symptoms, arrhythmia, chest pain/pressure, edema, exercise intolerance, orthopnea and palpitations.  Respiratory: Denied pulmonary symptoms, asthma, pleuritic pain, productive sputum, cough, dyspnea and wheezing.  Gastrointestinal: Denied, gastro-esophageal reflux, melena. +nausea and vomiting.  Genitourinary: Denied genitourinary symptoms including symptomatic vaginal discharge, pelvic relaxation issues, and urinary complaints.  Musculoskeletal: Denied musculoskeletal symptoms, stiffness, swelling, muscle weakness and myalgia.  Dermatologic: Denied dermatology symptoms, rash and scar.  Neurologic: Denied neurology symptoms, dizziness, headache, neck pain and syncope.  Psychiatric: Denied psychiatric symptoms, anxiety and depression.  Endocrine: Denied endocrine symptoms including hot flashes and night sweats.  Indications for ASA therapy (per uptodate) One of the following: Previous pregnancy with preeclampsia, especially early onset and with an adverse outcome Yes Multifetal gestation No Chronic hypertension No Type 1 or 2 diabetes mellitus No Chronic kidney disease No Autoimmune disease (antiphospholipid syndrome, systemic lupus erythematosus) No  Two or more of the following: Nulliparity No Obesity (body mass index >30 kg/m2) No Family history of preeclampsia in mother or sister No Age >=35 years No Sociodemographic characteristics (African American race, low socioeconomic level) No Personal risk factors (eg, previous pregnancy with low birth weight or small for gestational age infant, previous adverse pregnancy outcome [eg, stillbirth], interval >10 years between pregnancies) No   OBJECTIVE: Initial Physical Exam (New OB)  GENERAL APPEARANCE: alert, well appearing HEAD: normocephalic, atraumatic MOUTH: mucous membranes moist, pharynx normal without lesions THYROID : no thyromegaly or masses present BREASTS: patient declined exam LUNGS: clear to  auscultation, no wheezes, rales or rhonchi, symmetric air entry HEART: regular rate and rhythm, no murmurs ABDOMEN: soft, nontender, nondistended, no abnormal masses, no epigastric pain EXTREMITIES: no redness or tenderness in the calves or thighs SKIN: normal coloration and turgor, no rashes LYMPH NODES: no adenopathy palpable NEUROLOGIC: alert, oriented, normal speech, no focal findings or movement disorder noted  PELVIC EXAM declined  ASSESSMENT: 110w0d  Follow up US  scheduled for viability  PLAN: Routine prenatal care. Discussed US  finding of gestational sac without embryo and plan for f/u US  for viability. We discussed an overview of prenatal care and when to call. Reviewed diet, exercise, and weight gain recommendations in pregnancy. Discussed benefits of breastfeeding and lactation resources at [facility]. I answered all questions. Urine collected today. Will have labs at next visit  - needle phobia and would like to wait until she is far enough along to have genetic screening drawn with [facility] labs. Plans Pap PP. Rx sent for Zofran .  See orders  Josue Nip, CNM

## 2023-11-02 NOTE — Addendum Note (Signed)
 Addended by: Abram Abraham on: 11/02/2023 04:44 PM   Modules accepted: Orders

## 2023-11-04 LAB — CULTURE, OB URINE

## 2023-11-04 LAB — URINE CULTURE, OB REFLEX

## 2023-11-05 LAB — MONITOR DRUG PROFILE 14(MW)
Amphetamine Scrn, Ur: NEGATIVE ng/mL
BARBITURATE SCREEN URINE: NEGATIVE ng/mL
BENZODIAZEPINE SCREEN, URINE: NEGATIVE ng/mL
Buprenorphine, Urine: NEGATIVE ng/mL
Cocaine (Metab) Scrn, Ur: NEGATIVE ng/mL
Creatinine(Crt), U: 188.6 mg/dL (ref 20.0–300.0)
Fentanyl, Urine: NEGATIVE pg/mL
Meperidine Screen, Urine: NEGATIVE ng/mL
Methadone Screen, Urine: NEGATIVE ng/mL
OXYCODONE+OXYMORPHONE UR QL SCN: NEGATIVE ng/mL
Opiate Scrn, Ur: NEGATIVE ng/mL
Ph of Urine: 8 (ref 4.5–8.9)
Phencyclidine Qn, Ur: NEGATIVE ng/mL
Propoxyphene Scrn, Ur: NEGATIVE ng/mL
SPECIFIC GRAVITY: 1.024
Tramadol Screen, Urine: NEGATIVE ng/mL

## 2023-11-05 LAB — PROTEIN / CREATININE RATIO, URINE
Creatinine, Urine: 181.2 mg/dL
Protein, Ur: 13.3 mg/dL
Protein/Creat Ratio: 73 mg/g{creat} (ref 0–200)

## 2023-11-05 LAB — URINALYSIS, ROUTINE W REFLEX MICROSCOPIC
Bilirubin, UA: NEGATIVE
Glucose, UA: NEGATIVE
Ketones, UA: NEGATIVE
Leukocytes,UA: NEGATIVE
Nitrite, UA: NEGATIVE
Protein,UA: NEGATIVE
RBC, UA: NEGATIVE
Specific Gravity, UA: 1.023 (ref 1.005–1.030)
Urobilinogen, Ur: 1 mg/dL (ref 0.2–1.0)
pH, UA: 7.5 (ref 5.0–7.5)

## 2023-11-05 LAB — NICOTINE SCREEN, URINE: Cotinine Ql Scrn, Ur: POSITIVE ng/mL — AB

## 2023-11-05 LAB — CANNABINOID (GC/MS), URINE
Cannabinoid: POSITIVE — AB
Carboxy THC (GC/MS): 156 ng/mL

## 2023-11-06 ENCOUNTER — Other Ambulatory Visit: Payer: Self-pay

## 2023-11-06 ENCOUNTER — Emergency Department: Admission: EM | Admit: 2023-11-06 | Discharge: 2023-11-06 | Disposition: A | Discharge status: Home / Self Care

## 2023-11-06 ENCOUNTER — Emergency Department

## 2023-11-06 ENCOUNTER — Ambulatory Visit: Admission: RE | Admit: 2023-11-06 | Source: Ambulatory Visit

## 2023-11-06 DIAGNOSIS — F439 Reaction to severe stress, unspecified: Secondary | ICD-10-CM | POA: Insufficient documentation

## 2023-11-06 DIAGNOSIS — R109 Unspecified abdominal pain: Secondary | ICD-10-CM | POA: Diagnosis not present

## 2023-11-06 DIAGNOSIS — O26891 Other specified pregnancy related conditions, first trimester: Secondary | ICD-10-CM | POA: Insufficient documentation

## 2023-11-06 DIAGNOSIS — E876 Hypokalemia: Secondary | ICD-10-CM | POA: Diagnosis not present

## 2023-11-06 DIAGNOSIS — O99281 Endocrine, nutritional and metabolic diseases complicating pregnancy, first trimester: Secondary | ICD-10-CM | POA: Diagnosis not present

## 2023-11-06 DIAGNOSIS — R072 Precordial pain: Secondary | ICD-10-CM | POA: Insufficient documentation

## 2023-11-06 DIAGNOSIS — Z3A01 Less than 8 weeks gestation of pregnancy: Secondary | ICD-10-CM | POA: Insufficient documentation

## 2023-11-06 DIAGNOSIS — R079 Chest pain, unspecified: Secondary | ICD-10-CM

## 2023-11-06 DIAGNOSIS — O99341 Other mental disorders complicating pregnancy, first trimester: Secondary | ICD-10-CM | POA: Insufficient documentation

## 2023-11-06 LAB — BASIC METABOLIC PANEL WITH GFR
Anion gap: 8 (ref 5–15)
BUN: 6 mg/dL (ref 6–20)
CO2: 23 mmol/L (ref 22–32)
Calcium: 9.2 mg/dL (ref 8.9–10.3)
Chloride: 104 mmol/L (ref 98–111)
Creatinine, Ser: 0.68 mg/dL (ref 0.44–1.00)
GFR, Estimated: >60 mL/min (ref >60)
Glucose, Bld: 95 mg/dL (ref 70–99)
Potassium: 3.1 mmol/L — ABNORMAL LOW (ref 3.5–5.1)
Sodium: 135 mmol/L (ref 135–145)

## 2023-11-06 LAB — CBC
HCT: 42.5 % (ref 36.0–46.0)
Hemoglobin: 14.8 g/dL (ref 12.0–15.0)
MCH: 31.6 pg (ref 26.0–34.0)
MCHC: 34.8 g/dL (ref 30.0–36.0)
MCV: 90.6 fL (ref 80.0–100.0)
Platelets: 249 10*3/uL (ref 150–400)
RBC: 4.69 MIL/uL (ref 3.87–5.11)
RDW: 12.5 % (ref 11.5–15.5)
WBC: 11.5 10*3/uL — ABNORMAL HIGH (ref 4.0–10.5)
nRBC: 0 % (ref 0.0–0.2)

## 2023-11-06 LAB — POC URINE PREG, ED: Preg Test, Ur: POSITIVE — AB

## 2023-11-06 LAB — URINALYSIS, COMPLETE (UACMP) WITH MICROSCOPIC
Bacteria, UA: NONE SEEN
Bilirubin Urine: NEGATIVE
Glucose, UA: NEGATIVE mg/dL
Hgb urine dipstick: NEGATIVE
Ketones, ur: NEGATIVE mg/dL
Leukocytes,Ua: NEGATIVE
Nitrite: NEGATIVE
Protein, ur: NEGATIVE mg/dL
Specific Gravity, Urine: 1.004 — ABNORMAL LOW (ref 1.005–1.030)
WBC, UA: 0 WBC/hpf (ref 0–5)
pH: 6 (ref 5.0–8.0)

## 2023-11-06 LAB — HEPATIC FUNCTION PANEL
ALT: 16 U/L (ref 0–44)
AST: 19 U/L (ref 15–41)
Albumin: 3.8 g/dL (ref 3.5–5.0)
Alkaline Phosphatase: 49 U/L (ref 38–126)
Bilirubin, Direct: <0.1 mg/dL (ref 0.0–0.2)
Total Bilirubin: 0.4 mg/dL (ref 0.0–1.2)
Total Protein: 6.7 g/dL (ref 6.5–8.1)

## 2023-11-06 LAB — TROPONIN I (HIGH SENSITIVITY)
Troponin I (High Sensitivity): 3 ng/L (ref <18)
Troponin I (High Sensitivity): 4 ng/L (ref <18)

## 2023-11-06 LAB — LIPASE, BLOOD: Lipase: 27 U/L (ref 11–51)

## 2023-11-06 LAB — HCG, QUANTITATIVE, PREGNANCY: hCG, Beta Chain, Quant, S: 113942 m[IU]/mL — ABNORMAL HIGH (ref <5)

## 2023-11-06 MED ORDER — FAMOTIDINE 20 MG PO TABS
20.0000 mg | ORAL_TABLET | Freq: Once | ORAL | Status: AC
  Administered 2023-11-06: 20 mg via ORAL
  Filled 2023-11-06: qty 1

## 2023-11-06 MED ORDER — POTASSIUM CHLORIDE 20 MEQ PO PACK: 40.0000 meq | PACK | Freq: Two times a day (BID) | ORAL | Status: DC

## 2023-11-06 MED ORDER — POTASSIUM CHLORIDE 20 MEQ PO PACK
40.0000 meq | PACK | Freq: Once | ORAL | Status: AC
  Administered 2023-11-06: 40 meq via ORAL
  Filled 2023-11-06: qty 2

## 2023-11-06 MED ORDER — ACETAMINOPHEN 500 MG PO TABS
500.0000 mg | ORAL_TABLET | Freq: Once | ORAL | Status: AC
  Administered 2023-11-06: 500 mg via ORAL
  Filled 2023-11-06: qty 1

## 2023-11-06 NOTE — ED Triage Notes (Signed)
 Pt comes with chest pain that started at work today. Pt states two hours ago. Pt states stressful day at work with call outs and not sure if it has to do with it.

## 2023-11-06 NOTE — Discharge Instructions (Signed)
 Your evaluation in the emergency department was reassuring. I am unsure as to the exact cause of your chest discomfort earlier, but it seems to have resolved and we saw no concerning findings today.  Please do follow-up with your primary provider and OB/GYN provider for reevaluation and continue to take your prenatal vitamin as already prescribed.  Return to the emergency department with any new or worsening symptoms.

## 2023-11-06 NOTE — ED Notes (Signed)
 See triage notes. Patient c/o chest pain and dizziness that began about two hours ago. Patient stated the chest pain is a 6/10 and that the dizziness has resolved. Patient denies LOC. Patient stated she has had a stressful day at work.

## 2023-11-06 NOTE — ED Provider Notes (Signed)
 [facility] Provider Note    Event Date/Time   First MD Initiated Contact with Patient 11/06/23 1606     (approximate)   History   Chest Pain  Pt comes with chest pain that started at work today. Pt states two hours ago. Pt states stressful day at work with call outs and not sure if it has to do with it.       HPI Diane Watson is a 23 y.o. 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  Says this lasted for about an hour and she became somewhat lightheaded. -No loss of consciousness.  No pleuritic pain, no shortness of breath.  No recent leg swelling.  No history of DVT/PE, no recent surgery/stasis/travel.  No hormone use. -No vaginal bleeding or discharge, no leakage of fluid, no ongoing abdominal cramping.  No urinary symptoms.     Physical Exam   Triage Vital Signs: ED Triage Vitals  Encounter Vitals Group     BP 11/06/23 1539 (!) 123/56     Systolic BP Percentile --      Diastolic BP Percentile --      Pulse Rate 11/06/23 1539 (!) 103     Resp 11/06/23 1539 18     Temp 11/06/23 1539 98 F (36.7 C)     Temp src --      SpO2 11/06/23 1539 100 %     Weight 11/06/23 1537 216 lb 12.8 oz (98.3 kg)     Height 11/06/23 1537 5\' 2"  (1.575 m)     Head Circumference --      Peak Flow --      Pain Score 11/06/23 1537 6     Pain Loc --      Pain Education --      Exclude from Growth Chart --     Most recent vital signs: Vitals:   11/06/23 1539 11/06/23 1932  BP: (!) 123/56 (!) 112/47  Pulse: (!) 103 77  Resp: 18 16  Temp: 98 F (36.7 C) 98 F (36.7 C)  SpO2: 100% 99%   Heart rate 70s at time of my eval.   General: Awake, no distress.  CV:  Good peripheral perfusion. RRR, RP 2+ Resp:  Normal effort.  CTAB Abd:  No distention.  No significant tenderness to deep palpation to deep palpation throughout. No CVAT.  Other:  No leg swelling bilaterally, no tenderness to palpation along the venous system bilaterally    ED Results / Procedures / Treatments   Labs (all labs ordered are listed, but only abnormal results are displayed) Labs Reviewed  BASIC METABOLIC PANEL WITH GFR - Abnormal; Notable for the following components:      Result Value   Potassium 3.1 (*)    All other components within normal limits  CBC - Abnormal; Notable for the following components:   WBC 11.5 (*)    All other components within normal limits  URINALYSIS, COMPLETE (UACMP) WITH MICROSCOPIC - Abnormal; Notable for the following components:   Color, Urine STRAW (*)    APPearance CLEAR (*)    Specific Gravity, Urine 1.004 (*)    All other components within normal limits  HCG, QUANTITATIVE, PREGNANCY - Abnormal; Notable for the following components:   hCG, Beta Lethia Raveling 161,096 (*)  All other components within normal limits  POC URINE PREG, ED - Abnormal; Notable for the following components:   Preg Test, Ur Positive (*)    All other components within normal limits  HEPATIC FUNCTION PANEL  LIPASE, BLOOD  TROPONIN I (HIGH SENSITIVITY)  TROPONIN I (HIGH SENSITIVITY)     EKG  See ED course below.    RADIOLOGY See ED course below.     PROCEDURES:  Critical Care performed: No  Procedures   MEDICATIONS ORDERED IN ED: Medications  famotidine (PEPCID) tablet 20 mg (20 mg Oral Given 11/06/23 1732)  acetaminophen  (TYLENOL ) tablet 500 mg (500 mg Oral Given 11/06/23 1732)  potassium chloride (KLOR-CON) packet 40 mEq (40 mEq Oral Given 11/06/23 1731)     IMPRESSION / MDM / ASSESSMENT AND PLAN / ED COURSE  I reviewed the triage vital signs and the nursing notes.                              DDX/MDM/AP: Differential diagnosis includes, but is not limited to, GERD/dyspepsia, MSK pain, doubt  ACS, underlying pneumothorax, highly doubt pulmonary embolism based on clinical history and patient with a heart rate in the low 70s satting 100% with no ongoing symptoms and no pleuritic discomfort/shortness of breath on my eval.  With regard to vague lower abdominal cramping, suspect secondary to pregnancy, very benign exam here and no ongoing pain currently.  Was supposed to have outpatient dating ultrasound today consider possibility of ectopic pregnancy--will get OB ultrasound in addition to ensure no pelvic pathology as etiology of her abdominal cramping.  Suspect chest cramping was related to her episode of abdominal cramping.  Plan: - Labs - EKG - Chest x-ray - Tylenol , famotidine - US  - Reassess  Patient's presentation is most consistent with acute presentation with potential threat to life or bodily function.    ED course below.  Patient mains very well-appearing here and asymptomatic in emergency department.  Heart rate 70s throughout my initial eval and multiple repeat eval's.  No clinical concern for PE at this time, suspect dyspepsia or radiating pain from abdominal cramping.  Pelvic ultrasound reassuring, confirms IUP, no clinical concern for heterotopic pregnancy.  Offered trial of famotidine which patient declines generally is very reasonable.  Plan for PMD follow-up.  ED return precautions in place.  Patient agrees with plan.  Clinical Course as of 11/06/23 1935  Mon Nov 06, 2023  1702 BMP with mild hypokalemia, will replete p.o. hCG positive, patient is known to be pregnant at about 7 weeks [MM]  1703 Trop wnl [MM]  1703 Cbc w/ mild leukocytosis, stable from prior. No anemia [MM]  1704 Chest x-ray reviewed, unremarkable on my interpretation.  Radiology report reviewed. [MM]  1704 Ecg = sinus rhythm, rate 78, no ST elevation or depression, no significant repolarization abnormality, normal axis, normal intervals.  No evidence of ischemia no arrhythmia on my read. [MM]  1804  urinalysis reviewed, unremarkable [MM]  1818 Rpt trop stable [MM]  1837 Biliary labs normal Lipase normal [MM]  1906 US : IMPRESSION: Single living intrauterine pregnancy with gestational age [redacted] weeks 3 days and estimated date of confinement 06/21/2024. No complicating features.   [MM]  1919 Patient reevaluated, feels very well, no recurrent symptoms.  Remains hemodynamically stable here with no tachycardia.  No pleuritic pain, no cough or shortness of breath, no tachypnea.  Overall suspect she had diffuse abdominal cramping that radiated up into her chest,  consider dyspepsia, offered trial of outpatient famotidine though patient prefers to defer this and follow-up with her primary provider which I believe is very reasonable.  No evidence of any acute pathology today, stable for discharge home.  ED return precautions in place.  Patient agrees with plan. [MM]    Clinical Course User Index [MM] Collis Deaner, MD     FINAL CLINICAL IMPRESSION(S) / ED DIAGNOSES   Final diagnoses:  Nonspecific chest pain  Abdominal cramping  Less than [redacted] weeks gestation of pregnancy     Rx / DC Orders   ED Discharge Orders     None        Note:  This document was prepared using Dragon voice recognition software and may include unintentional dictation errors.   Collis Deaner, MD 11/06/23 [place]

## 2023-11-29 NOTE — Progress Notes (Unsigned)
    Return Prenatal Note   Subjective   23 y.o. G2P0101 at [redacted]w[redacted]d presents for this follow-up prenatal visit.  Patient is feeling well with no concerns.  Patient reports: Movement: Present Contractions: Not present  Objective   Flow sheet Vitals: Pulse Rate: 82 BP: (!) 114/59 Total weight gain: 19 lb 1.6 oz (8.664 kg)  General Appearance  No acute distress, well appearing, and well nourished Pulmonary   Normal work of breathing Neurologic   Alert and oriented to person, place, and time Psychiatric   Mood and affect within normal limits  Assessment/Plan   Plan  23 y.o. G2P0101 at [redacted]w[redacted]d presents for follow-up OB visit. Reviewed prenatal record including previous visit note.  Supervision of high risk pregnancy, antepartum Feeling well with no concerns today. Nausea is improved with the medications Unable to hear FHT today with doppler. Cardiac activity visualized on BSUS.  Keeping daily BP log. All values are normal per patient.  Reviewed red flag warning signs anticipatory guidance for upcoming prenatal care.   [facility] blood labs collected today. Urine labs previously done at [facility]      Orders Placed This Encounter  Procedures   US  OB Comp + 14 Wk    Standing Status:   Future    Expected Date:   01/30/2024    Expiration Date:   05/31/2024    Reason for Exam (SYMPTOM  OR DIAGNOSIS REQUIRED):   Fetal Anatomic Survey    Preferred Imaging Location?:   [facility]   NOB Panel   MaterniT 21 plus Core, Blood    Is patient insulin dependent?:   No    Gestational Age (GA), weeks:   67    Date on which patient was at this GA:   11/29/2023    GA Calculation Method:   LMP    GA Date:   06/20/2024    Number of fetuses:   1    Reason for screen:   OTHER             screen    Donor egg?:   N   Hemoglobin A1c   TSH + free T4   No follow-ups on file.   No future appointments.   For next visit:  continue with routine prenatal care     Donato Fu, CNM   06/05/254:25 PM

## 2023-11-30 ENCOUNTER — Ambulatory Visit (INDEPENDENT_AMBULATORY_CARE_PROVIDER_SITE_OTHER): Admitting: Certified Nurse Midwife

## 2023-11-30 VITALS — BP 114/59 | HR 82 | Wt 217.1 lb

## 2023-11-30 DIAGNOSIS — O99210 Obesity complicating pregnancy, unspecified trimester: Secondary | ICD-10-CM

## 2023-11-30 DIAGNOSIS — Z3689 Encounter for other specified antenatal screening: Secondary | ICD-10-CM

## 2023-11-30 DIAGNOSIS — Z1159 Encounter for screening for other viral diseases: Secondary | ICD-10-CM

## 2023-11-30 DIAGNOSIS — O0991 Supervision of high risk pregnancy, unspecified, first trimester: Secondary | ICD-10-CM | POA: Diagnosis not present

## 2023-11-30 DIAGNOSIS — Z3A10 10 weeks gestation of pregnancy: Secondary | ICD-10-CM

## 2023-11-30 DIAGNOSIS — O0990 Supervision of high risk pregnancy, unspecified, unspecified trimester: Secondary | ICD-10-CM

## 2023-11-30 DIAGNOSIS — Z1379 Encounter for other screening for genetic and chromosomal anomalies: Secondary | ICD-10-CM

## 2023-11-30 DIAGNOSIS — Z114 Encounter for screening for human immunodeficiency virus [HIV]: Secondary | ICD-10-CM

## 2023-11-30 DIAGNOSIS — Z113 Encounter for screening for infections with a predominantly sexual mode of transmission: Secondary | ICD-10-CM

## 2023-11-30 NOTE — Assessment & Plan Note (Addendum)
 Feeling well with no concerns today. Nausea is improved with the medications Unable to hear FHT today with doppler. Cardiac activity visualized on BSUS.  Keeping daily BP log. All values are normal per patient.  Reviewed red flag warning signs anticipatory guidance for upcoming prenatal care.   [facility] blood labs collected today. Urine labs previously done at [facility]

## 2023-12-02 LAB — CBC/D/PLT+RPR+RH+ABO+RUBIGG...
Antibody Screen: NEGATIVE
Basophils Absolute: 0 10*3/uL (ref 0.0–0.2)
Basos: 0 %
EOS (ABSOLUTE): 0.4 10*3/uL (ref 0.0–0.4)
Eos: 3 %
HCV Ab: NONREACTIVE
HIV Screen 4th Generation wRfx: NONREACTIVE
Hematocrit: 41.6 % (ref 34.0–46.6)
Hemoglobin: 14.5 g/dL (ref 11.1–15.9)
Hepatitis B Surface Ag: NEGATIVE
Immature Grans (Abs): 0 10*3/uL (ref 0.0–0.1)
Immature Granulocytes: 0 %
Lymphocytes Absolute: 2.1 10*3/uL (ref 0.7–3.1)
Lymphs: 19 %
MCH: 31.9 pg (ref 26.6–33.0)
MCHC: 34.9 g/dL (ref 31.5–35.7)
MCV: 92 fL (ref 79–97)
Monocytes Absolute: 0.7 10*3/uL (ref 0.1–0.9)
Monocytes: 6 %
Neutrophils Absolute: 7.9 10*3/uL — ABNORMAL HIGH (ref 1.4–7.0)
Neutrophils: 72 %
Platelets: 244 10*3/uL (ref 150–450)
RBC: 4.54 x10E6/uL (ref 3.77–5.28)
RDW: 12.1 % (ref 11.7–15.4)
RPR Ser Ql: NONREACTIVE
Rh Factor: POSITIVE
Rubella Antibodies, IGG: 8.31 {index} (ref >0.99)
Varicella zoster IgG: NONREACTIVE
WBC: 11.1 10*3/uL — ABNORMAL HIGH (ref 3.4–10.8)

## 2023-12-02 LAB — TSH+FREE T4
Free T4: 1.1 ng/dL (ref 0.82–1.77)
TSH: 1 u[IU]/mL (ref 0.450–4.500)

## 2023-12-02 LAB — HEMOGLOBIN A1C
Est. average glucose Bld gHb Est-mCnc: 85 mg/dL
Hgb A1c MFr Bld: 4.6 % — ABNORMAL LOW (ref 4.8–5.6)

## 2023-12-02 LAB — HCV INTERPRETATION

## 2023-12-09 LAB — MATERNIT 21 PLUS CORE, BLOOD
Fetal Fraction: 23
Result (T21): NEGATIVE
Trisomy 13 (Patau syndrome): NEGATIVE
Trisomy 18 (Edwards syndrome): NEGATIVE
Trisomy 21 (Down syndrome): NEGATIVE

## 2023-12-28 ENCOUNTER — Encounter: Payer: Self-pay | Admitting: Obstetrics

## 2023-12-28 ENCOUNTER — Ambulatory Visit (INDEPENDENT_AMBULATORY_CARE_PROVIDER_SITE_OTHER): Admitting: Obstetrics

## 2023-12-28 VITALS — BP 113/70 | HR 97 | Wt 215.0 lb

## 2023-12-28 DIAGNOSIS — O0990 Supervision of high risk pregnancy, unspecified, unspecified trimester: Secondary | ICD-10-CM

## 2023-12-28 DIAGNOSIS — G43109 Migraine with aura, not intractable, without status migrainosus: Secondary | ICD-10-CM | POA: Diagnosis not present

## 2023-12-28 DIAGNOSIS — Z3A15 15 weeks gestation of pregnancy: Secondary | ICD-10-CM

## 2023-12-28 DIAGNOSIS — O09299 Supervision of pregnancy with other poor reproductive or obstetric history, unspecified trimester: Secondary | ICD-10-CM | POA: Insufficient documentation

## 2023-12-28 DIAGNOSIS — O1410 Severe pre-eclampsia, unspecified trimester: Secondary | ICD-10-CM | POA: Diagnosis not present

## 2023-12-28 DIAGNOSIS — Z1379 Encounter for other screening for genetic and chromosomal anomalies: Secondary | ICD-10-CM

## 2023-12-28 MED ORDER — ASPIRIN 81 MG PO TBEC: 81.0000 mg | DELAYED_RELEASE_TABLET | Freq: Every day | ORAL | 2 refills | Status: DC

## 2023-12-28 NOTE — Progress Notes (Signed)
    Return Prenatal Note   Assessment/Plan   Plan  23 y.o. G2P0101 at [redacted]w[redacted]d presents for follow-up OB visit. Reviewed prenatal record including previous visit note.  Delivery of pregnancy by cesarean section -Desires TOLAC -Will schedule visit with MD in 3rd trimester  History of pre-eclampsia in prior pregnancy, currently pregnant -Will start taking LDASA  Supervision of high risk pregnancy, antepartum -AFP at next visit -Anatomy US  scheduled for 01/23/24 -Anticipatory guidance about 2nd trimester and fetal development   No orders of the defined types were placed in this encounter.  Return in about 4 weeks (around 01/25/2024).   Future Appointments  Date Time Provider Department Center  01/23/2024  1:00 PM [facility] US  1 [facility] None  01/23/2024  2:35 PM Slaughterbeck, Damien, CNM [facility] None    For next visit:  Routine prenatal care    Subjective   Diane Watson is feeling well. She is having some discomfort at night. Feeling some baby flutters. She desires a TOLAC. Her first CS was for arrest of dilation at 4 cm. She would like AFP at her next visit.  Movement: Present Contractions: Not present  Objective   Flow sheet Vitals: Pulse Rate: 97 BP: 113/70 Fetal Heart Rate (bpm): 152 Total weight gain: 17 lb (7.711 kg)  General Appearance  No acute distress, well appearing, and well nourished Pulmonary   Normal work of breathing Neurologic   Alert and oriented to person, place, and time Psychiatric   Mood and affect within normal limits  Eleanor Canny, CNM 12/28/23 2:58 PM

## 2023-12-28 NOTE — Assessment & Plan Note (Signed)
-  Will start taking LDASA

## 2023-12-28 NOTE — Assessment & Plan Note (Signed)
-  AFP at next visit -Anatomy US  scheduled for 01/23/24 -Anticipatory guidance about 2nd trimester and fetal development

## 2023-12-28 NOTE — Assessment & Plan Note (Signed)
-  Desires TOLAC -Will schedule visit with MD in 3rd trimester

## 2024-01-23 ENCOUNTER — Ambulatory Visit (INDEPENDENT_AMBULATORY_CARE_PROVIDER_SITE_OTHER): Admitting: Certified Nurse Midwife

## 2024-01-23 ENCOUNTER — Ambulatory Visit

## 2024-01-23 ENCOUNTER — Encounter: Payer: Self-pay | Admitting: Certified Nurse Midwife

## 2024-01-23 VITALS — BP 97/56 | HR 80 | Wt 216.3 lb

## 2024-01-23 DIAGNOSIS — O0990 Supervision of high risk pregnancy, unspecified, unspecified trimester: Secondary | ICD-10-CM

## 2024-01-23 DIAGNOSIS — Z1379 Encounter for other screening for genetic and chromosomal anomalies: Secondary | ICD-10-CM

## 2024-01-23 DIAGNOSIS — Z3A18 18 weeks gestation of pregnancy: Secondary | ICD-10-CM | POA: Diagnosis not present

## 2024-01-23 DIAGNOSIS — Z3689 Encounter for other specified antenatal screening: Secondary | ICD-10-CM | POA: Diagnosis not present

## 2024-01-23 DIAGNOSIS — Z3A19 19 weeks gestation of pregnancy: Secondary | ICD-10-CM | POA: Diagnosis not present

## 2024-01-23 NOTE — Assessment & Plan Note (Signed)
 Anatomy US  done today. No concerns or complaints. Reviewed timing of appointments Reviewed red flag warning signs anticipatory guidance for upcoming prenatal care.

## 2024-01-23 NOTE — Addendum Note (Signed)
 Addended by: KIZZIE CAMELIA CROME on: 01/23/2024 02:46 PM   Modules accepted: Orders

## 2024-01-23 NOTE — Progress Notes (Signed)
    Return Prenatal Note   Subjective   23 y.o. G2P0101 at [redacted]w[redacted]d presents for this follow-up prenatal visit.  Patient is doing well. She had her anatomy scan done today and it was normal. AFP done today as well. She has no new concerns.  Patient reports: Movement: Present Contractions: Not present  Objective   Flow sheet Vitals: Pulse Rate: 80 BP: (!) 97/56 Fundal Height: 18 cm Fetal Heart Rate (bpm): 150 Total weight gain: 18 lb 4.8 oz (8.301 kg)  General Appearance  No acute distress, well appearing, and well nourished Pulmonary   Normal work of breathing Neurologic   Alert and oriented to person, place, and time Psychiatric   Mood and affect within normal limits  Assessment/Plan   Plan  23 y.o. G2P0101 at [redacted]w[redacted]d presents for follow-up OB visit. Reviewed prenatal record including previous visit note.  Supervision of high risk pregnancy, antepartum Anatomy US  done today. No concerns or complaints. Reviewed timing of appointments Reviewed red flag warning signs anticipatory guidance for upcoming prenatal care.         For next visit:  continue with routine prenatal care     Damien Parsley, CNM [facility] [place] 07/29/252:43 PM

## 2024-01-25 ENCOUNTER — Encounter: Admitting: Certified Nurse Midwife

## 2024-01-25 ENCOUNTER — Other Ambulatory Visit

## 2024-01-25 LAB — AFP, SERUM, OPEN SPINA BIFIDA
AFP MoM: 0.88
AFP Value: 32.6 ng/mL
Gest. Age on Collection Date: 18.5 wk
Maternal Age At EDD: 24.4 a
OSBR Risk 1 IN: 10000
Test Results:: NEGATIVE
Weight: 216 [lb_av]

## 2024-02-19 ENCOUNTER — Ambulatory Visit (INDEPENDENT_AMBULATORY_CARE_PROVIDER_SITE_OTHER): Admitting: Certified Nurse Midwife

## 2024-02-19 ENCOUNTER — Encounter: Payer: Self-pay | Admitting: Certified Nurse Midwife

## 2024-02-19 VITALS — BP 121/60 | HR 78 | Wt 225.6 lb

## 2024-02-19 DIAGNOSIS — Z3685 Encounter for antenatal screening for Streptococcus B: Secondary | ICD-10-CM | POA: Diagnosis not present

## 2024-02-19 DIAGNOSIS — Z3A28 28 weeks gestation of pregnancy: Secondary | ICD-10-CM

## 2024-02-19 DIAGNOSIS — O0990 Supervision of high risk pregnancy, unspecified, unspecified trimester: Secondary | ICD-10-CM | POA: Diagnosis not present

## 2024-02-19 DIAGNOSIS — Z113 Encounter for screening for infections with a predominantly sexual mode of transmission: Secondary | ICD-10-CM | POA: Diagnosis not present

## 2024-02-19 DIAGNOSIS — Z130 Encounter for screening for diseases of the blood and blood-forming organs and certain disorders involving the immune mechanism: Secondary | ICD-10-CM

## 2024-02-19 NOTE — Progress Notes (Signed)
    Return Prenatal Note   Subjective   23 y.o. G2P0101 at [redacted]w[redacted]d presents for this follow-up prenatal visit.  Patient is doing well. She has no new concerns today. Patient reports: Movement: Present Contractions: Not present  Objective   Flow sheet Vitals: Pulse Rate: 78 BP: 121/60 Fundal Height: 22 cm Fetal Heart Rate (bpm): 150 Total weight gain: 27 lb 9.6 oz (12.5 kg)  General Appearance  No acute distress, well appearing, and well nourished Pulmonary   Normal work of breathing Neurologic   Alert and oriented to person, place, and time Psychiatric   Mood and affect within normal limits  Assessment/Plan   Plan  23 y.o. G2P0101 at [redacted]w[redacted]d presents for follow-up OB visit. Reviewed prenatal record including previous visit note.  Supervision of high risk pregnancy, antepartum Feeling well with no concerns. Older kid started kindergarten today! Reviewed 28 week labs for NV Reviewed red flag warning signs anticipatory guidance for upcoming prenatal care.        No orders of the defined types were placed in this encounter.  No follow-ups on file.   Future Appointments  Date Time Provider Department Center  03/18/2024  9:00 AM [facility] None  03/18/2024  9:35 AM Kalissa Grays, Damien, CNM [facility] None    For next visit:  ROB with 1 hour glucola, third trimester labs, and Tdap     Damien Parsley, CNM [facility] [place] 08/25/251:44 PM

## 2024-02-19 NOTE — Assessment & Plan Note (Signed)
 Feeling well with no concerns. Older kid started kindergarten today! Reviewed 28 week labs for NV Reviewed red flag warning signs anticipatory guidance for upcoming prenatal care.

## 2024-02-20 ENCOUNTER — Encounter: Admitting: Certified Nurse Midwife

## 2024-03-18 ENCOUNTER — Other Ambulatory Visit

## 2024-03-18 ENCOUNTER — Ambulatory Visit (INDEPENDENT_AMBULATORY_CARE_PROVIDER_SITE_OTHER): Admitting: Certified Nurse Midwife

## 2024-03-18 VITALS — BP 114/52 | HR 79 | Wt 245.1 lb

## 2024-03-18 DIAGNOSIS — Z130 Encounter for screening for diseases of the blood and blood-forming organs and certain disorders involving the immune mechanism: Secondary | ICD-10-CM

## 2024-03-18 DIAGNOSIS — Z23 Encounter for immunization: Secondary | ICD-10-CM | POA: Diagnosis not present

## 2024-03-18 DIAGNOSIS — Z3A28 28 weeks gestation of pregnancy: Secondary | ICD-10-CM

## 2024-03-18 DIAGNOSIS — O26849 Uterine size-date discrepancy, unspecified trimester: Secondary | ICD-10-CM | POA: Insufficient documentation

## 2024-03-18 DIAGNOSIS — Z3A26 26 weeks gestation of pregnancy: Secondary | ICD-10-CM | POA: Diagnosis not present

## 2024-03-18 DIAGNOSIS — Z3685 Encounter for antenatal screening for Streptococcus B: Secondary | ICD-10-CM

## 2024-03-18 DIAGNOSIS — O0990 Supervision of high risk pregnancy, unspecified, unspecified trimester: Secondary | ICD-10-CM

## 2024-03-18 DIAGNOSIS — O26842 Uterine size-date discrepancy, second trimester: Secondary | ICD-10-CM

## 2024-03-18 DIAGNOSIS — O34219 Maternal care for unspecified type scar from previous cesarean delivery: Secondary | ICD-10-CM

## 2024-03-18 DIAGNOSIS — Z113 Encounter for screening for infections with a predominantly sexual mode of transmission: Secondary | ICD-10-CM

## 2024-03-18 NOTE — Progress Notes (Signed)
    Return Prenatal Note   Subjective   23 y.o. G2P0101 at [redacted]w[redacted]d presents for this follow-up prenatal visit.  Patient is doing well. She has has some swelling in her feet, but it comes and goes. Patient reports: Movement: Present Contractions: Not present  Objective   Flow sheet Vitals: Pulse Rate: 79 BP: (!) 114/52 Fundal Height: 30 cm Fetal Heart Rate (bpm): 150 Total weight gain: 47 lb 1.6 oz (21.4 kg)  General Appearance  No acute distress, well appearing, and well nourished Pulmonary   Normal work of breathing Neurologic   Alert and oriented to person, place, and time Psychiatric   Mood and affect within normal limits   Assessment/Plan   Plan  23 y.o. G2P0101 at [redacted]w[redacted]d presents for follow-up OB visit. Reviewed prenatal record including previous visit note.  Delivery of pregnancy by cesarean section Desires TOLAC. Will need MD visit for next ROB  Uterine size date discrepancy 26w - 30cm (growth ordered)  Supervision of high risk pregnancy, antepartum 28 week labs drawn today. RhIG n/a RPR, CBC, HIV and 1-hour GTT sent Reviewed the importance of monitoring fetal movement as well as warning signs for pre-eclampsia and preterm labor.       Orders Placed This Encounter  Procedures   US  OB Follow Up    Standing Status:   Future    Expected Date:   03/25/2024    Expiration Date:   03/18/2025    Reason for exam::   Growth for size>dates    Preferred imaging location?:   Internal   Tdap vaccine greater than or equal to 7yo IM   Return in about 2 weeks (around 04/01/2024) for routine prenatal care.   Future Appointments  Date Time Provider Department Center  04/02/2024  8:35 AM Leigh Sober, MD [facility] None    For next visit:  continue with routine prenatal care     Damien Parsley, CNM [facility] [place] 09/22/259:53 AM

## 2024-03-18 NOTE — Assessment & Plan Note (Signed)
 28 week labs drawn today. RhIG n/a RPR, CBC, HIV and 1-hour GTT sent Reviewed the importance of monitoring fetal movement as well as warning signs for pre-eclampsia and preterm labor.

## 2024-03-18 NOTE — Patient Instructions (Signed)
 Second Trimester of Pregnancy  The second trimester of pregnancy is from week 14 through week 27. This is months 4 through 6 of pregnancy. During the second trimester: Morning sickness is less or has stopped. You may have more energy. You may feel hungry more often. At this time, your unborn baby is growing very fast. At the end of the sixth month, the unborn baby may be up to 12 inches long and weigh about 1 pounds. You will likely start to feel the baby move between 16 and 20 weeks of pregnancy. Body changes during your second trimester Your body continues to change during this time. The changes usually go away after your baby is born. Physical changes You will gain more weight. Your belly will get bigger. You may begin to get stretch marks on your hips, belly, and breasts. Your breasts will keep growing and may hurt. You may get dark spots or blotches on your face. A dark line from your belly button to the pubic area may appear. This line is called linea nigra. Your hair may grow faster and get thicker. Health changes You may have headaches. You may have heartburn. You may pee more often. You may have swollen, bulging veins (varicose veins). You may have trouble pooping (constipation), or swollen veins in the butt that can itch or get painful (hemorrhoids). You may have back pain. This is caused by: Weight gain. Pregnancy hormones that are relaxing the joints in your pelvis. Follow these instructions at home: Medicines Talk to your health care provider if you're taking medicines. Ask if the medicines are safe to take during pregnancy. Your provider may change the medicines that you take. Do not take any medicines unless told to by your provider. Take a prenatal vitamin that has at least 600 micrograms (mcg) of folic acid. Do not use herbal medicines, illegal drugs, or medicines that are not approved by your provider. Eating and drinking While you're pregnant your body needs  extra food for your growing baby. Talk with your provider about what to eat while pregnant. Activity Most women are able to exercise during pregnancy. Exercises may need to change as your pregnancy goes on. Talk to your provider about your activities and exercise routines. Relieving pain and discomfort Wear a good, supportive bra if your breasts hurt. Rest with your legs raised if you have leg cramps or low back pain. Take warm sitz baths to soothe pain from hemorrhoids. Use hemorrhoid cream if your provider says it's okay. Do not douche. Do not use tampons or scented pads. Do not use hot tubs, steam rooms, or saunas. Safety Wear your seatbelt at all times when you're in a car. Talk to your provider if someone hits you, hurts you, or yells at you. Talk with your provider if you're feeling sad or have thoughts of hurting yourself. Lifestyle Certain things can be harmful while you're pregnant. It's best to avoid the following: Do not drink alcohol,smoke, vape, or use products with nicotine  or tobacco in them. If you need help quitting, talk with your provider. Avoid cat litter boxes and soil used by cats. These things carry germs that can cause harm to your pregnancy and your baby. General instructions Keep all follow-up visits. It helps you and your unborn baby stay as healthy as possible. Write down your questions. Take them to your prenatal visits. Your provider will: Talk with you about your overall health. Give you advice or refer you to specialists who can help with different needs,  including: Prenatal education classes. Mental health and counseling. Foods and healthy eating. Ask for help if you need help with food. Where to find more information American Pregnancy Association: americanpregnancy.org [employer] of Obstetricians and Gynecologists: acog.org Office on [employer] Health: TravelLesson.ca Contact a health care provider if: You have a headache that does not go away  when you take medicine. You have any of these problems: You can't eat or drink. You throw up or feel like you may throw up. You have watery poop (diarrhea) for 2 days or more. You have pain when you pee or your pee smells bad. You have been sick for 2 days or more and are not getting better. Contact your provider right away if: You have any of these coming from your vagina: Abnormal discharge. Bad-smelling fluid. Bleeding. Your baby is moving less than usual. You have contractions, belly cramping, or have pain in your pelvis or lower back. You have symptoms of high blood pressure or preeclampsia. These include: A severe, throbbing headache that does not go away. Sudden or extreme swelling of your face, hands, legs, or feet. Vision problems: You see spots. You have blurry vision. Your eyes are sensitive to light. If you can't reach the provider, go to an urgent care or emergency room. Get help right away if: You faint, become confused, or can't think clearly. You have chest pain or trouble breathing. You have any kind of injury, such as from a fall or a car crash. These symptoms may be an emergency. Call 911 right away. Do not wait to see if the symptoms will go away. Do not drive yourself to the hospital. This information is not intended to replace advice given to you by your health care provider. Make sure you discuss any questions you have with your health care provider. Document Revised: 03/16/2023 Document Reviewed: 10/14/2022 Elsevier Patient Education  2024 Elsevier Inc.Tdap (Tetanus, Diphtheria, Pertussis) Vaccine: What You Need to Know Many vaccine information statements are available in Spanish and other languages. See PromoAge.com.br. 1. Why get vaccinated? Tdap vaccine can prevent tetanus, diphtheria, and pertussis. Diphtheria and pertussis spread from person to person. Tetanus enters the body through cuts or wounds. TETANUS (T) causes painful stiffening of the  muscles. Tetanus can lead to serious health problems, including being unable to open the mouth, having trouble swallowing and breathing, or death. DIPHTHERIA (D) can lead to difficulty breathing, heart failure, paralysis, or death. PERTUSSIS (aP), also known as whooping cough, can cause uncontrollable, violent coughing that makes it hard to breathe, eat, or drink. Pertussis can be extremely serious especially in babies and young children, causing pneumonia, convulsions, brain damage, or death. In teens and adults, it can cause weight loss, loss of bladder control, passing out, and rib fractures from severe coughing. 2. Tdap vaccine Tdap is only for children 7 years and older, adolescents, and adults.  Adolescents should receive a single dose of Tdap, preferably at age 62 or 12 years. Pregnant people should get a dose of Tdap during every pregnancy, preferably during the early part of the third trimester, to help protect the newborn from pertussis. Infants are most at risk for severe, life-threatening complications from pertussis. Adults who have never received Tdap should get a dose of Tdap. Also, adults should receive a booster dose of either Tdap or Td (a different vaccine that protects against tetanus and diphtheria but not pertussis) every 10 years, or after 5 years in the case of a severe or dirty wound or burn. Tdap  may be given at the same time as other vaccines. 3. Talk with your health care provider Tell your vaccine provider if the person getting the vaccine: Has had an allergic reaction after a previous dose of any vaccine that protects against tetanus, diphtheria, or pertussis, or has any severe, life-threatening allergies Has had a coma, decreased level of consciousness, or prolonged seizures within 7 days after a previous dose of any pertussis vaccine (DTP, DTaP, or Tdap) Has seizures or another nervous system problem Has ever had Guillain-Barr Syndrome (also called GBS) Has had  severe pain or swelling after a previous dose of any vaccine that protects against tetanus or diphtheria In some cases, your health care provider may decide to postpone Tdap vaccination until a future visit. People with minor illnesses, such as a cold, may be vaccinated. People who are moderately or severely ill should usually wait until they recover before getting Tdap vaccine.  Your health care provider can give you more information. 4. Risks of a vaccine reaction Pain, redness, or swelling where the shot was given, mild fever, headache, feeling tired, and nausea, vomiting, diarrhea, or stomachache sometimes happen after Tdap vaccination. People sometimes faint after medical procedures, including vaccination. Tell your provider if you feel dizzy or have vision changes or ringing in the ears.  As with any medicine, there is a very remote chance of a vaccine causing a severe allergic reaction, other serious injury, or death. 5. What if there is a serious problem? An allergic reaction could occur after the vaccinated person leaves the clinic. If you see signs of a severe allergic reaction (hives, swelling of the face and throat, difficulty breathing, a fast heartbeat, dizziness, or weakness), call 9-1-1 and get the person to the nearest hospital. For other signs that concern you, call your health care provider.  Adverse reactions should be reported to the Vaccine Adverse Event Reporting System (VAERS). Your health care provider will usually file this report, or you can do it yourself. Visit the VAERS website at www.vaers.LAgents.no or call 845 574 2847. VAERS is only for reporting reactions, and VAERS staff members do not give medical advice. 6. The National Vaccine Injury Compensation Program The [employer] Vaccine Injury Compensation Program (VICP) is a federal program that was created to compensate people who may have been injured by certain vaccines. Claims regarding alleged injury or death due to  vaccination have a time limit for filing, which may be as short as two years. Visit the VICP website at SpiritualWord.at or call 901-089-2041 to learn about the program and about filing a claim. 7. How can I learn more? Ask your health care provider. Call your local or state health department. Visit the website of the Food and Drug Administration (FDA) for vaccine package inserts and additional information at FinderList.no. Contact the Centers for Disease Control and Prevention (CDC): Call 724-357-8993 (1-800-CDC-INFO) or Visit CDC's website at PicCapture.uy. Source: CDC Vaccine Information Statement Tdap (Tetanus, Diphtheria, Pertussis) Vaccine (01/31/2020) This same material is available at FootballExhibition.com.br for no charge. This information is not intended to replace advice given to you by your health care provider. Make sure you discuss any questions you have with your health care provider. Document Revised: 09/28/2022 Document Reviewed: 07/29/2022 Elsevier Patient Education  2024 [employer].

## 2024-03-18 NOTE — Assessment & Plan Note (Signed)
 Desires TOLAC. Will need MD visit for next ROB

## 2024-03-18 NOTE — Assessment & Plan Note (Signed)
 26w - 30cm (growth ordered)

## 2024-03-19 ENCOUNTER — Ambulatory Visit: Payer: Self-pay | Admitting: Certified Nurse Midwife

## 2024-03-19 ENCOUNTER — Other Ambulatory Visit: Payer: Self-pay

## 2024-03-19 DIAGNOSIS — O99810 Abnormal glucose complicating pregnancy: Secondary | ICD-10-CM

## 2024-03-19 LAB — 28 WEEK RH+PANEL
Basophils Absolute: 0 x10E3/uL (ref 0.0–0.2)
Basos: 0 %
EOS (ABSOLUTE): 0.2 x10E3/uL (ref 0.0–0.4)
Eos: 2 %
Gestational Diabetes Screen: 156 mg/dL — ABNORMAL HIGH (ref 70–139)
HIV Screen 4th Generation wRfx: NONREACTIVE
Hematocrit: 39 % (ref 34.0–46.6)
Hemoglobin: 13 g/dL (ref 11.1–15.9)
Immature Grans (Abs): 0.1 x10E3/uL (ref 0.0–0.1)
Immature Granulocytes: 1 %
Lymphocytes Absolute: 1.8 x10E3/uL (ref 0.7–3.1)
Lymphs: 15 %
MCH: 31.2 pg (ref 26.6–33.0)
MCHC: 33.3 g/dL (ref 31.5–35.7)
MCV: 94 fL (ref 79–97)
Monocytes Absolute: 0.5 x10E3/uL (ref 0.1–0.9)
Monocytes: 4 %
Neutrophils Absolute: 9.4 x10E3/uL — ABNORMAL HIGH (ref 1.4–7.0)
Neutrophils: 78 %
Platelets: 255 x10E3/uL (ref 150–450)
RBC: 4.17 x10E6/uL (ref 3.77–5.28)
RDW: 12.2 % (ref 11.7–15.4)
RPR Ser Ql: NONREACTIVE
WBC: 12.1 x10E3/uL — ABNORMAL HIGH (ref 3.4–10.8)

## 2024-03-20 ENCOUNTER — Other Ambulatory Visit

## 2024-03-20 ENCOUNTER — Ambulatory Visit (INDEPENDENT_AMBULATORY_CARE_PROVIDER_SITE_OTHER)

## 2024-03-20 DIAGNOSIS — O26842 Uterine size-date discrepancy, second trimester: Secondary | ICD-10-CM

## 2024-03-20 DIAGNOSIS — Z3A27 27 weeks gestation of pregnancy: Secondary | ICD-10-CM | POA: Diagnosis not present

## 2024-03-20 DIAGNOSIS — O99810 Abnormal glucose complicating pregnancy: Secondary | ICD-10-CM

## 2024-03-21 ENCOUNTER — Ambulatory Visit

## 2024-03-21 ENCOUNTER — Ambulatory Visit: Payer: Self-pay | Admitting: Certified Nurse Midwife

## 2024-03-21 LAB — GESTATIONAL GLUCOSE TOLERANCE
Glucose, Fasting: 86 mg/dL (ref 70–94)
Glucose, GTT - 1 Hour: 155 mg/dL (ref 70–179)
Glucose, GTT - 2 Hour: 133 mg/dL (ref 70–154)
Glucose, GTT - 3 Hour: 105 mg/dL (ref 70–139)

## 2024-03-28 NOTE — Progress Notes (Signed)
    Return Prenatal Note   Subjective  23 y.o. G2P0101 at [redacted]w[redacted]d presents for this follow-up prenatal visit. Pregnancy notable for hx of preeclampsia in prior pregnancy, elevated BMI, prior CD x 1, pre-term delivery at [redacted]w[redacted]d for preeclampsia.   Patient well today, is taking daily ASA and BP has been wnl throughout pregnancy thus far.  Would like to discuss TOLAC today, prior CD done for arrest of dilation at 4cm, greatly desires to try for vaginal birth.  Declines Flu vaccine today. Size > dates at 26wks, growth US  obtained and EFW 55%.   Patient reports: Movement: Present Contractions: Not present Denies vaginal bleeding or leaking fluid. Objective  Flow sheet Vitals: Pulse Rate: 79 BP: (!) 100/56 Fundal Height: 32 cm Fetal Heart Rate (bpm): 127 Total weight gain: 41 lb 3.2 oz (18.7 kg)  General Appearance  No acute distress, well appearing, and well nourished Pulmonary   Normal work of breathing Neurologic   Alert and oriented to person, place, and time Psychiatric   Mood and affect within normal limits   Assessment/Plan   Plan  23 y.o. G2P0101 at [redacted]w[redacted]d presents for follow-up OB visit. Reviewed prenatal record including previous visit note.  1. Supervision of high risk pregnancy, antepartum (Primary) -Declines Flu vaccine today, counseled   2. History of pre-eclampsia in prior pregnancy, currently pregnant -Continue daily ASA  3. History of cesarean delivery, currently pregnant -VBAC calculator score: 38% success, discussed what this means and how it can inform her choice -Counseled regarding TOLAC vs RCS; risks/benefits discussed in detail. All questions answered.  Patient elects for TOLAC, consent signed 04/02/2024.  4. Obesity in pregnancy -PG-BMI 36.21, TWG 41#, counseled to slow, can increase risk of recurrent preeclampsia and decrease chance of successful VBAC -Check growth around 32wks -Weekly NSTs at 37wks  5. Uterine size-date discrepancy in third  trimester -Today is measuring 3-4cm beyond EGA, 26wk EFW 55% and will be checking growth again around 32wks  Return in about 2 weeks (around 04/16/2024) for ROB.   Future Appointments  Date Time Provider Department Center  04/16/2024  8:15 AM Sl[place], Damien, CNM [facility] None    For next visit:  continue with routine prenatal care    Estil Mangle, DO [facility]

## 2024-03-28 NOTE — Patient Instructions (Signed)
 Birth Options After a C-section: What to Expect If you have delivered a baby by C-section, also called cesarean birth, you may be able to deliver your next baby vaginally. This is called vaginal birth after cesarean (VBAC). VBAC may be a safe option for you depending on your health and other factors. You'll need to talk to your health care provider to see if you can have a VBAC. Options for future births After a C-section, your options for any future births may include: A scheduled C-section. A trial of labor to try to have a vaginal birth. Keep these things in mind: A successful trial of labor ends with a vaginal birth. You may still need a C-section if the trial of labor is not successful. A trial of labor is safest when done in a facility where a C-section is also possible. What are the benefits? The benefits of delivering your baby vaginally include: No need for surgery. A shorter hospital stay. A faster recovery time. A lower risk for infection. Less blood loss. What are the risks? Your provider will talk with you about risks. Risks of labor after a previous C-section may include: You may still need a C-section. Infection, injury, or blood loss. Tearing of the past C-section scar, also called uterine rupture. This is rare but can be very harmful to you and your baby. If a repeat C-section is needed, the risks may include: Infection, blood loss, or blood clot. Injury to organs in your belly. Needing to remove the uterus if there's a problem. This is also called a hysterectomy. Placenta problems in future pregnancies. How do I know if VBAC is right for me? You have a C-section scar that is low in the bikini area and goes side to side. This is also called a low transverse scar. You have not had more than two C-sections with bikini line cuts, also known as low transverse cuts. You have no other scars on your uterus, past ruptures, or problems with your uterus. You have had a  vaginal birth before. You and your baby are healthy. Your labor starts on its own just before or on your due date. When is VBAC not an option? Your health care team may not recommend VBAC if: You've had more than two C-sections. You have had a torn uterus or a big surgery on your uterus. The scar on your uterus from a previous C-section makes it unsafe to deliver vaginally, such as one that goes up and down, also called a vertical scar. What else should I know about my options? Delivering a baby through a VBAC is similar to having a normal spontaneous vaginal delivery. The following are safe with a VBAC: Delivering twins. Turning the baby from a breech position to head down position (external cephalic version). Epidurals. VBAC should be done in facilities where an emergency C-section can be done. VBAC is not recommended for home births. Changes in your health or your baby's health during your pregnancy or labor may stop you from having a VBAC. Questions to ask your health care provider Is a trial of labor right for me? What are my chances of a successful vaginal birth? Is my preferred birth location safe for a trial of labor? Where to find more information To learn more, go to these websites. [employer] of Obstetricians and Gynecologists: acog.org American Pregnancy Association: americanpregnancy.org Click Search and type VBAC. Find the link you need. This information is not intended to replace advice given to you by your health  care provider. Make sure you discuss any questions you have with your health care provider. Document Revised: 03/28/2023 Document Reviewed: 03/28/2023 Elsevier Patient Education  2025 [employer].

## 2024-04-02 ENCOUNTER — Ambulatory Visit: Admitting: Obstetrics

## 2024-04-02 VITALS — BP 100/56 | HR 79 | Wt 239.2 lb

## 2024-04-02 DIAGNOSIS — O09299 Supervision of pregnancy with other poor reproductive or obstetric history, unspecified trimester: Secondary | ICD-10-CM

## 2024-04-02 DIAGNOSIS — O0993 Supervision of high risk pregnancy, unspecified, third trimester: Secondary | ICD-10-CM | POA: Diagnosis not present

## 2024-04-02 DIAGNOSIS — O26843 Uterine size-date discrepancy, third trimester: Secondary | ICD-10-CM | POA: Diagnosis not present

## 2024-04-02 DIAGNOSIS — O09293 Supervision of pregnancy with other poor reproductive or obstetric history, third trimester: Secondary | ICD-10-CM

## 2024-04-02 DIAGNOSIS — E669 Obesity, unspecified: Secondary | ICD-10-CM

## 2024-04-02 DIAGNOSIS — Z3A28 28 weeks gestation of pregnancy: Secondary | ICD-10-CM

## 2024-04-02 DIAGNOSIS — O99210 Obesity complicating pregnancy, unspecified trimester: Secondary | ICD-10-CM | POA: Insufficient documentation

## 2024-04-02 DIAGNOSIS — O99213 Obesity complicating pregnancy, third trimester: Secondary | ICD-10-CM

## 2024-04-02 DIAGNOSIS — O34219 Maternal care for unspecified type scar from previous cesarean delivery: Secondary | ICD-10-CM | POA: Diagnosis not present

## 2024-04-02 DIAGNOSIS — O0990 Supervision of high risk pregnancy, unspecified, unspecified trimester: Secondary | ICD-10-CM

## 2024-04-16 ENCOUNTER — Encounter: Payer: Self-pay | Admitting: Certified Nurse Midwife

## 2024-04-16 ENCOUNTER — Ambulatory Visit: Admitting: Certified Nurse Midwife

## 2024-04-16 VITALS — BP 117/68 | HR 85 | Wt 238.0 lb

## 2024-04-16 DIAGNOSIS — O09299 Supervision of pregnancy with other poor reproductive or obstetric history, unspecified trimester: Secondary | ICD-10-CM

## 2024-04-16 DIAGNOSIS — E669 Obesity, unspecified: Secondary | ICD-10-CM | POA: Diagnosis not present

## 2024-04-16 DIAGNOSIS — O99210 Obesity complicating pregnancy, unspecified trimester: Secondary | ICD-10-CM

## 2024-04-16 DIAGNOSIS — O99213 Obesity complicating pregnancy, third trimester: Secondary | ICD-10-CM | POA: Diagnosis not present

## 2024-04-16 DIAGNOSIS — O0993 Supervision of high risk pregnancy, unspecified, third trimester: Secondary | ICD-10-CM | POA: Diagnosis not present

## 2024-04-16 DIAGNOSIS — O0990 Supervision of high risk pregnancy, unspecified, unspecified trimester: Secondary | ICD-10-CM

## 2024-04-16 DIAGNOSIS — Z3A30 30 weeks gestation of pregnancy: Secondary | ICD-10-CM

## 2024-04-16 DIAGNOSIS — O09293 Supervision of pregnancy with other poor reproductive or obstetric history, third trimester: Secondary | ICD-10-CM

## 2024-04-16 NOTE — Assessment & Plan Note (Signed)
 Normotensive today. Reviewed how magnesium  in last labor could have affected the outcome.

## 2024-04-16 NOTE — Assessment & Plan Note (Signed)
 32 week growth US  ordered

## 2024-04-16 NOTE — Assessment & Plan Note (Signed)
 Doing well overall. Reviewed comfort measures for heartburn Reviewed kick counts and preterm labor warning signs. Instructed to call office or come to hospital with persistent headache, vision changes, regular contractions, leaking of fluid, decreased fetal movement or vaginal bleeding.

## 2024-04-16 NOTE — Progress Notes (Signed)
    Return Prenatal Note   Subjective   23 y.o. G2P0101 at [redacted]w[redacted]d presents for this follow-up prenatal visit.  Patient is doing well. She has been having some heartburn at night on and off. She would like to start taking medications.  Patient reports: Movement: Present Contractions: Not present  Objective   Flow sheet Vitals: Pulse Rate: 85 BP: 117/68 Fundal Height: 30 cm Fetal Heart Rate (bpm): 135 Total weight gain: 40 lb (18.1 kg)  General Appearance  No acute distress, well appearing, and well nourished Pulmonary   Normal work of breathing Neurologic   Alert and oriented to person, place, and time Psychiatric   Mood and affect within normal limits   Assessment/Plan   Plan  23 y.o. G2P0101 at [redacted]w[redacted]d presents for follow-up OB visit. Reviewed prenatal record including previous visit note.  History of pre-eclampsia in prior pregnancy, currently pregnant Normotensive today. Reviewed how magnesium  in last labor could have affected the outcome.   Supervision of high risk pregnancy, antepartum Doing well overall. Reviewed comfort measures for heartburn Reviewed kick counts and preterm labor warning signs. Instructed to call office or come to hospital with persistent headache, vision changes, regular contractions, leaking of fluid, decreased fetal movement or vaginal bleeding.   Obesity in pregnancy 32 week growth US  ordered      Orders Placed This Encounter  Procedures   US  OB Follow Up    Standing Status:   Future    Expiration Date:   07/17/2024    Reason for exam::   Growth/AFI    Preferred imaging location?:   Internal   No follow-ups on file.   Future Appointments  Date Time Provider Department Center  04/30/2024  8:15 AM [facility] US  1 [facility] None  04/30/2024 10:15 AM Jayne Harlene CROME, CNM [facility] None    For next visit:  continue with routine prenatal care     Damien Parsley, CNM [facility] 04/16/2509:25 AM

## 2024-04-29 NOTE — Patient Instructions (Incomplete)
 Third Trimester of Pregnancy  The third trimester of pregnancy is from week 28 through week 40. This is months 7 through 9. The third trimester is a time when your baby is growing fast. Body changes during your third trimester Your body continues to change during this time. The changes usually go away after your baby is born. Physical changes You will continue to gain weight. You may get stretch marks on your hips, belly, and breasts. Your breasts will keep growing and may hurt. A yellow fluid (colostrum) may leak from your breasts. This is the first milk you're making for your baby. Your hair may grow faster and get thicker. In some cases, you may get hair loss. Your belly button may stick out. You may have more swelling in your hands, face, or ankles. Health changes You may have heartburn. You may feel short of breath. This is caused by the uterus that is now bigger. You may have more aches in the pelvis, back, or thighs. You may have more tingling or numbness in your hands, arms, and legs. You may pee more often. You may have trouble pooping (constipation) or swollen veins in the butt that can itch or get painful (hemorrhoids). Other changes You may have more problems sleeping. You may notice the baby moving lower in your belly (dropping). You may have more fluid coming from your vagina. Your joints may feel loose, and you may have pain around your pelvic bone. Follow these instructions at home: Medicines Take medicines only as told by your health care provider. Some medicines are not safe during pregnancy. Your provider may change the medicines that you take. Do not take any medicines unless told to by your provider. Take a prenatal vitamin that has at least 600 micrograms (mcg) of folic acid. Do not use herbal medicines, illegal drugs, or medicines that are not approved by your provider. Eating and drinking While you're pregnant your body needs additional nutrition to help  support your growing baby. Talk with your provider about your nutritional needs. Activity Most women are able to exercise regularly during pregnancy. Exercise routines may need to change at the end of your pregnancy. Talk to your provider about your activities and exercise routine. Relieving pain and discomfort Rest often with your legs raised if you have leg cramps or low back pain. Take warm sitz baths to soothe pain from hemorrhoids. Use hemorrhoid cream if your provider says it's okay. Wear a good, supportive bra if your breasts hurt. Do not use hot tubs, steam rooms, or saunas. Do not douche. Do not use tampons or scented pads. Safety Talk to your provider before traveling far distances. Wear your seatbelt at all times when you're in a car. Talk to your provider if someone hits you, hurts you, or yells at you. Preparing for birth To prepare for your baby: Take childbirth and breastfeeding classes. Visit the hospital and tour the maternity area. Buy a rear-facing car seat. Learn how to install it in your car. General instructions Avoid cat litter boxes and soil used by cats. These things carry germs that can cause harm to your pregnancy and your baby. Do not drink alcohol, smoke, vape, or use products with nicotine  or tobacco in them. If you need help quitting, talk with your provider. Keep all follow-up visits for your third trimester. Your provider will do more exams and tests during this trimester. Write down your questions. Take them to your prenatal visits. Your provider also will: Talk with you about  your overall health. Give you advice or refer you to specialists who can help with different needs, including: Mental health and counseling. Foods and healthy eating. Ask for help if you need help with food. Where to find more information American Pregnancy Association: americanpregnancy.org [employer]: acog.org Office on [employer] Health:  travellesson.ca Contact a health care provider if: You have a headache that does not go away when you take medicine. You have any of these problems: You can't eat or drink. You have nausea and vomiting. You have watery poop (diarrhea) for 2 days or more. You have pain when you pee, or your pee smells bad. You have been sick for 2 days or more and aren't getting better. Contact your provider right away if: You have any of these coming from your vagina: Abnormal discharge. Bad-smelling fluid. Bleeding. Your baby is moving less than usual. You have signs of labor: You have any contractions, belly cramping, or have pain in your pelvis or lower back before 37 weeks of pregnancy (preterm labor). You have regular contractions that are less than 5 minutes apart. Your water breaks. You have symptoms of high blood pressure or preeclampsia. These include: A severe, throbbing headache that does not go away. Sudden or extreme swelling of your face, hands, legs, or feet. Vision problems: You see spots. You have blurry vision. Your eyes are sensitive to light. If you can't reach your provider, go to an urgent care or emergency room. Get help right away if: You faint, become confused, or can't think clearly. You have chest pain or trouble breathing. You have any kind of injury, such as from a fall or a car crash. These symptoms may be an emergency. Call 911 right away. Do not wait to see if the symptoms will go away. Do not drive yourself to the hospital. This information is not intended to replace advice given to you by your health care provider. Make sure you discuss any questions you have with your health care provider. Document Revised: 03/16/2023 Document Reviewed: 10/14/2022 Elsevier Patient Education  2024 [employer] Inc.    RSV Vaccine: What You Need to Know Many vaccine information statements are available in Spanish and other languages. See classthemes.se. 1. Why get  vaccinated? RSV vaccine can prevent lower respiratory tract disease caused by respiratory syncytial virus (RSV). RSV is a common respiratory virus that usually causes mild, cold-like symptoms. RSV can cause illness in people of all ages but may be especially serious for infants and older adults. RSV is the most common cause of hospitalization in [place] infants. Infants up to 90 months of age (especially those 6 months and younger) and children who were born prematurely, or who have chronic lung or heart disease, or a weakened immune system, are at increased risk of severe RSV disease. RSV infections can be dangerous for certain adults. Adults at highest risk for severe RSV disease include older adults, especially those with chronic heart or lung disease, a weakened immune system, certain other chronic medical conditions, or who live in nursing homes. RSV spreads through direct contact with the virus, such as when droplets from an infected person's cough or sneeze contact your eyes, nose, or mouth. It can also be spread by someone touching a surface, such as a doorknob, that has the virus on it, and then touching your face. Symptoms of RSV infection may include runny nose, decreased appetite, coughing, sneezing, fever, or wheezing. In very young infants, symptoms of RSV may also include  irritability (fussiness), decreased activity, or apnea (pauses in breathing for more than 10 seconds). Most people recover in a week or two, but RSV can be more serious, resulting in shortness of breath and low oxygen levels. RSV can cause bronchiolitis (inflammation of the small airways in the lung) and pneumonia (infection of the lungs). RSV can also lead to worsening of other medical conditions such as asthma, chronic obstructive pulmonary disease (a chronic disease of the lungs that makes it hard to breathe), or heart failure (when the heart cannot pump enough blood and oxygen throughout the body). Infants and older adults  who get very sick from RSV may need to be hospitalized. Some may even die. 2. RSV vaccine There are two immunization options available for protecting infants against RSV: maternal vaccine for the pregnant person or preventive antibodies given to the baby. Only one of these options is needed for most babies to be protected. CDC recommends a one-time dose of RSV vaccine for pregnant people from week 32 through week 36 of pregnancy for the prevention of RSV disease in their infants during the first 6 months of life. This vaccine is recommended to be given from September through January for most of the [place] . However, in some locations (for example, the territories, [place] , [place] , and parts of [place] ), the timing of vaccination may differ based on the time of year when RSV circulates in the area. CDC recommends a one-time-dose of RSV vaccine for everyone 75 years and older and for adults 70 through 24 years of age who are at increased risk of severe RSV disease. Adults 13 through 25 years old who are at increased risk include those with chronic heart or lung disease, a weakened immune system, or certain other chronic medical conditions, and those who are residents of nursing homes. RSV vaccine may be given at the same time as other vaccines. 3. Talk with your health care provider Tell your vaccination provider if the person getting the vaccine: Has had an allergic reaction after a previous dose of RSV vaccine, or has any severe, life-threatening allergies In some cases, your health care provider may decide to postpone RSV vaccination until a future visit.  People with minor illnesses, such as a cold, may be vaccinated. People who are moderately or severely ill should usually wait until they recover before getting RSV vaccine.  Your health care provider can give you more information. 4. Risks of a vaccine reaction Pain, redness, and swelling where the shot is given, fatigue (feeling tired), fever,  headache, nausea, diarrhea, and muscle or joint pain can happen after RSV vaccination. Serious neurologic conditions, including Guillain-Barr syndrome (GBS), have been reported after RSV vaccination in some older adults. At this time, an increased risk of GBS following RSV vaccine among persons aged 43 years and older cannot be confirmed or ruled out. Preterm birth and high blood pressure during pregnancy, including pre-eclampsia, have been reported among pregnant people who received RSV vaccine. It is unclear whether these events were caused by the vaccine. People sometimes faint after medical procedures, including vaccination. Tell your provider if you feel dizzy or have vision changes or ringing in the ears.  As with any medicine, there is a very remote chance of a vaccine causing a severe allergic reaction, other serious injury, or death. V-Safe is a safety monitoring system that lets you share with CDC how you, or your dependent, feel after getting RSV vaccine. You can find information and enroll in V-Safe radarlocations.no. 5. What  if there is a serious problem? An allergic reaction could occur after the vaccinated person leaves the clinic. If you see signs of a severe allergic reaction (hives, swelling of the face and throat, difficulty breathing, a fast heartbeat, dizziness, or weakness), call 9-1-1 and get the person to the nearest hospital. For other signs that concern you, call your health care provider.  Adverse reactions should be reported to the Vaccine Adverse Event Reporting System (VAERS). Your health care provider will usually file this report, or you can do it yourself. Visit the VAERS website at www.vaers.lagents.no or call (346)024-4264. VAERS is only for reporting reactions, and VAERS staff members do not give medical advice. 6. How can I learn more? Ask your health care provider. Call your local or state health department. Visit the website of the Food and Drug Administration (FDA)  for vaccine package inserts and additional information at finderlist.no Contact the Centers for Disease Control and Prevention (CDC): Call (970) 556-7516 (1-800-CDC-INFO) or Visit CDC's vaccine website at piccapture.uy Source: CDC Vaccine Information Statement RSV (Respiratory Syncytial Virus) Vaccine (04/13/2023) This same material is available at tonerpromos.no for no charge. This information is not intended to replace advice given to you by your health care provider. Make sure you discuss any questions you have with your health care provider. Document Revised: 05/09/2023 Document Reviewed: 06/29/2022 Elsevier Patient Education  2024 Elsevier Inc.   Group B Streptococcus Test During Pregnancy Why am I having this test? Routine testing, also called screening, for group B streptococcus (GBS) is recommended for all pregnant women between the 36th and 37th week of pregnancy. GBS is a type of bacteria that can be passed from mother to baby during childbirth. Screening will help guide whether or not you will need treatment during labor and delivery to prevent complications such as: An infection in your uterus during labor. An infection in your uterus after delivery. A serious infection in your baby after delivery, such as pneumonia, meningitis, or sepsis. GBS screening is not often done before 36 weeks of pregnancy unless you go into labor prematurely. What happens if I have group B streptococcus? If testing shows that you have GBS, your health care provider will recommend treatment with IV antibiotics during labor and delivery. This treatment significantly decreases the risk of complications for you and your baby. If you have a planned C-section and you have GBS, you may not need to be treated with antibiotics because GBS is usually passed to babies after labor starts and your water breaks. If you are in labor or your water breaks before your C-section, it is  possible for GBS to get into your uterus and be passed to your baby, so you might need treatment. Is there a chance I may not need to be tested? You may not need to be tested for GBS if: You have a urine test that shows GBS before 36 to 37 weeks. You had a baby with GBS infection after a previous delivery. In these cases, you will automatically be treated for GBS during labor and delivery. What is being tested? This test is done to check if you have group B streptococcus in your vagina or rectum. What kind of sample is taken? To collect samples for this test, your health care provider will swab your vagina and rectum with a cotton swab. The sample is then sent to the lab to see if GBS is present. What happens during the test?  You will remove your clothing from the waist down. You  will lie down on an exam table in the same position as you would for a pelvic exam. Your health care provider will swab your vagina and rectum to collect samples for a culture test. You will be able to go home after the test and do all your usual activities. How are the results reported? The test results are reported as positive or negative. What do the results mean? A positive test means you are at risk for passing GBS to your baby during labor and delivery. Your health care provider will recommend that you are treated with an IV antibiotic during labor and delivery. A negative test means you are at very low risk of passing GBS to your baby. There is still a low risk of passing GBS to your baby because sometimes test results may report that you do not have a condition when you do (false-negative result) or there is a chance that you may become infected with GBS after the test is done. You most likely will not need to be treated with an antibiotic during labor and delivery. Talk with your health care provider about what your results mean. Questions to ask your health care provider Ask your health care provider, or  the department that is doing the test: When will my results be ready? How will I get my results? What are my treatment options? Summary Routine testing (screening) for group B streptococcus (GBS) is recommended for all pregnant women between the 36th and 37th week of pregnancy. GBS is a type of bacteria that can be passed from mother to baby during childbirth. If testing shows that you have GBS, your health care provider will recommend that you are treated with IV antibiotics during labor and delivery. This treatment almost always prevents infection in newborns. This information is not intended to replace advice given to you by your health care provider. Make sure you discuss any questions you have with your health care provider. Document Revised: 05/30/2022 Document Reviewed: 05/30/2022 Elsevier Patient Education  2024 [employer]. Third Trimester of Pregnancy  The third trimester of pregnancy is from week 28 through week 40. This is months 7 through 9. The third trimester is a time when your baby is growing fast. Body changes during your third trimester Your body continues to change during this time. The changes usually go away after your baby is born. Physical changes You will continue to gain weight. You may get stretch marks on your hips, belly, and breasts. Your breasts will keep growing and may hurt. A yellow fluid (colostrum) may leak from your breasts. This is the first milk you're making for your baby. Your hair may grow faster and get thicker. In some cases, you may get hair loss. Your belly button may stick out. You may have more swelling in your hands, face, or ankles. Health changes You may have heartburn. You may feel short of breath. This is caused by the uterus that is now bigger. You may have more aches in the pelvis, back, or thighs. You may have more tingling or numbness in your hands, arms, and legs. You may pee more often. You may have trouble pooping  (constipation) or swollen veins in the butt that can itch or get painful (hemorrhoids). Other changes You may have more problems sleeping. You may notice the baby moving lower in your belly (dropping). You may have more fluid coming from your vagina. Your joints may feel loose, and you may have pain around your pelvic bone. Follow these  instructions at home: Medicines Take medicines only as told by your health care provider. Some medicines are not safe during pregnancy. Your provider may change the medicines that you take. Do not take any medicines unless told to by your provider. Take a prenatal vitamin that has at least 600 micrograms (mcg) of folic acid. Do not use herbal medicines, illegal drugs, or medicines that are not approved by your provider. Eating and drinking While you're pregnant your body needs additional nutrition to help support your growing baby. Talk with your provider about your nutritional needs. Activity Most women are able to exercise regularly during pregnancy. Exercise routines may need to change at the end of your pregnancy. Talk to your provider about your activities and exercise routine. Relieving pain and discomfort Rest often with your legs raised if you have leg cramps or low back pain. Take warm sitz baths to soothe pain from hemorrhoids. Use hemorrhoid cream if your provider says it's okay. Wear a good, supportive bra if your breasts hurt. Do not use hot tubs, steam rooms, or saunas. Do not douche. Do not use tampons or scented pads. Safety Talk to your provider before traveling far distances. Wear your seatbelt at all times when you're in a car. Talk to your provider if someone hits you, hurts you, or yells at you. Preparing for birth To prepare for your baby: Take childbirth and breastfeeding classes. Visit the hospital and tour the maternity area. Buy a rear-facing car seat. Learn how to install it in your car. General instructions Avoid cat  litter boxes and soil used by cats. These things carry germs that can cause harm to your pregnancy and your baby. Do not drink alcohol, smoke, vape, or use products with nicotine  or tobacco in them. If you need help quitting, talk with your provider. Keep all follow-up visits for your third trimester. Your provider will do more exams and tests during this trimester. Write down your questions. Take them to your prenatal visits. Your provider also will: Talk with you about your overall health. Give you advice or refer you to specialists who can help with different needs, including: Mental health and counseling. Foods and healthy eating. Ask for help if you need help with food. Where to find more information American Pregnancy Association: americanpregnancy.org [employer]: acog.org Office on [employer] Health: travellesson.ca Contact a health care provider if: You have a headache that does not go away when you take medicine. You have any of these problems: You can't eat or drink. You have nausea and vomiting. You have watery poop (diarrhea) for 2 days or more. You have pain when you pee, or your pee smells bad. You have been sick for 2 days or more and aren't getting better. Contact your provider right away if: You have any of these coming from your vagina: Abnormal discharge. Bad-smelling fluid. Bleeding. Your baby is moving less than usual. You have signs of labor: You have any contractions, belly cramping, or have pain in your pelvis or lower back before 37 weeks of pregnancy (preterm labor). You have regular contractions that are less than 5 minutes apart. Your water breaks. You have symptoms of high blood pressure or preeclampsia. These include: A severe, throbbing headache that does not go away. Sudden or extreme swelling of your face, hands, legs, or feet. Vision problems: You see spots. You have blurry vision. Your eyes are sensitive to  light. If you can't reach your provider, go to an urgent care or  emergency room. Get help right away if: You faint, become confused, or can't think clearly. You have chest pain or trouble breathing. You have any kind of injury, such as from a fall or a car crash. These symptoms may be an emergency. Call 911 right away. Do not wait to see if the symptoms will go away. Do not drive yourself to the hospital. This information is not intended to replace advice given to you by your health care provider. Make sure you discuss any questions you have with your health care provider. Document Revised: 03/16/2023 Document Reviewed: 10/14/2022 Elsevier Patient Education  2024 Elsevier Inc. Problems to Watch for During Pregnancy During pregnancy, your body goes through many changes. Some changes may be uncomfortable. But most changes are not a serious problem. It's important to learn when certain signs and symptoms may be a problem. Talk with your health care provider about any medical conditions you have. Make sure you know the symptoms to watch for. Reporting problems early will prevent complications. Problems to watch for during pregnancy You're more likely to get an infection during pregnancy. Let your provider know if you have signs of infection, such as: A fever. A bad-smelling fluid from your vagina. Peeing too often, wanting to pee urgently, or pain when you pee. Also, let your provider know if: You're very tired, you feel dizzy, or you faint. You have watery poop (diarrhea) for 24 hours or longer. You throw up or feel like throwing up for 24 hours or longer. You have cramping in your belly or have pain in your hips or lower back. You have spotting, bleeding, or leaking of fluid from your vagina. You have pain, swelling, or redness in an arm or leg. You should also watch for signs of high blood pressure and preeclampsia. These signs can be very serious. They include: A headache that doesn't go  away when you take medicine. Sudden or very bad swelling of your face, hands, legs, or feet. Problems seeing, such as: You see spots. You have blurry vision. You may be sensitive to light. Why it's important to watch for these problems Watching and reporting problems to your provider can help prevent complications that may affect you and your baby. These include: Higher risk of giving birth early. Infection that may be passed on to your baby. Higher risk for stillbirth. Follow these instructions at home:  Take your medicines only as told. Keep all follow-up visits. Your provider needs to monitor your health and your baby's health. Where to find more information To learn more, go to these websites: Centers for Disease Control and Prevention (CDC) at tonerpromos.no. Then: Click Health Topics A-Z. Type urgent maternal warning signs in the search box. [employer] [employer] (ACOG): acog.org Contact a health care provider if: You have any problems while you're pregnant. You feel your baby moving less than usual. You have any of these things: You have strong emotions, such as sadness or anxiety, that affect your daily life. You do not feel safe in your home. You use tobacco, alcohol, or drugs, and you need help to stop. Get help right away if: You faint, have a seizure, or cannot think clearly. You have chest pain or difficulty breathing. You have any of the following symptoms and you were unable to reach your provider: You have symptoms of infection, including a fever, or have vaginal bleeding. You have symptoms of high blood pressure or preeclampsia. You have signs or symptoms of labor before 37  weeks of pregnancy. These include: Contractions that are 5 minutes or less apart, or that increase in frequency, intensity, or length. Sudden, sharp pain in the belly, or low back pain. Any amount of fluid that flows from your vagina without stopping. These symptoms  may be an emergency. Call 911 right away. Do not wait to see if the symptoms will go away. Do not drive yourself to the hospital. This information is not intended to replace advice given to you by your health care provider. Make sure you discuss any questions you have with your health care provider. Document Revised: 11/22/2022 Document Reviewed: 11/22/2022 Elsevier Patient Education  2024 [employer].

## 2024-04-29 NOTE — Progress Notes (Unsigned)
    Return Prenatal Note   Subjective   23 y.o. G2P0101 at [redacted]w[redacted]d presents for this follow-up prenatal visit.  Patient feeling well, active baby. Growth ultrasound today. Denies signs/symptoms of pre-eclampsia. Patient reports: Movement: Present Contractions: Irritability  Objective   Flow sheet Vitals: Pulse Rate: 81 BP: 122/76 Fundal Height: 34 cm Fetal Heart Rate (bpm): 135 Total weight gain: 37 lb 3.2 oz (16.9 kg)  General Appearance  No acute distress, well appearing, and well nourished Pulmonary   Normal work of breathing Neurologic   Alert and oriented to person, place, and time Psychiatric   Mood and affect within normal limits   Assessment/Plan   Plan  23 y.o. G2P0101 at [redacted]w[redacted]d presents for follow-up OB visit. Reviewed prenatal record including previous visit note.  Supervision of high risk pregnancy, antepartum Reviewed kick counts and preterm labor warning signs. Instructed to call office or come to hospital with persistent headache, vision changes, regular contractions, leaking of fluid, decreased fetal movement or vaginal bleeding.  Macrosomia affecting management of mother in third trimester, single gestation Preliminary u/s report reviewed, 89.1th percentile, AC is in the 94.4 percentile. EFW: 2445g, 5lb 6oz AFI 19  Obesity in pregnancy NSTs to begin at 37w. Growth u/s in 4w.      Orders Placed This Encounter  Procedures   US  OB Follow Up    Standing Status:   Future    Expected Date:   05/30/2024    Expiration Date:   04/30/2025    Reason for exam::   growth    Preferred imaging location?:   Internal   Respiratory syncytial virus vaccine, preF, subunit, bivalent,(Abrysvo)   Return in 2 weeks (on 05/14/2024) for ROB.   Future Appointments  Date Time Provider Department Center  05/28/2024 11:15 AM [facility] US  1 [facility] None  05/29/2024  8:15 AM Dominic, Jinnie Jansky, CNM [facility] None     For next visit:  continue with routine prenatal care      Harlene LITTIE Cisco, CNM  05/01/2511:06 PM

## 2024-04-30 ENCOUNTER — Ambulatory Visit (INDEPENDENT_AMBULATORY_CARE_PROVIDER_SITE_OTHER): Admitting: Certified Nurse Midwife

## 2024-04-30 ENCOUNTER — Encounter: Payer: Self-pay | Admitting: Certified Nurse Midwife

## 2024-04-30 ENCOUNTER — Ambulatory Visit (INDEPENDENT_AMBULATORY_CARE_PROVIDER_SITE_OTHER)

## 2024-04-30 VITALS — BP 122/76 | HR 81 | Wt 235.2 lb

## 2024-04-30 DIAGNOSIS — Z23 Encounter for immunization: Secondary | ICD-10-CM

## 2024-04-30 DIAGNOSIS — O0993 Supervision of high risk pregnancy, unspecified, third trimester: Secondary | ICD-10-CM

## 2024-04-30 DIAGNOSIS — O3663X0 Maternal care for excessive fetal growth, third trimester, not applicable or unspecified: Secondary | ICD-10-CM | POA: Insufficient documentation

## 2024-04-30 DIAGNOSIS — O09299 Supervision of pregnancy with other poor reproductive or obstetric history, unspecified trimester: Secondary | ICD-10-CM

## 2024-04-30 DIAGNOSIS — O0990 Supervision of high risk pregnancy, unspecified, unspecified trimester: Secondary | ICD-10-CM

## 2024-04-30 DIAGNOSIS — O99210 Obesity complicating pregnancy, unspecified trimester: Secondary | ICD-10-CM

## 2024-04-30 DIAGNOSIS — O34219 Maternal care for unspecified type scar from previous cesarean delivery: Secondary | ICD-10-CM

## 2024-04-30 DIAGNOSIS — E669 Obesity, unspecified: Secondary | ICD-10-CM

## 2024-04-30 DIAGNOSIS — O99213 Obesity complicating pregnancy, third trimester: Secondary | ICD-10-CM | POA: Diagnosis not present

## 2024-04-30 DIAGNOSIS — Z3A34 34 weeks gestation of pregnancy: Secondary | ICD-10-CM

## 2024-04-30 DIAGNOSIS — J45909 Unspecified asthma, uncomplicated: Secondary | ICD-10-CM | POA: Insufficient documentation

## 2024-04-30 DIAGNOSIS — Z9229 Personal history of other drug therapy: Secondary | ICD-10-CM

## 2024-04-30 DIAGNOSIS — Z3A32 32 weeks gestation of pregnancy: Secondary | ICD-10-CM

## 2024-04-30 NOTE — Assessment & Plan Note (Signed)
 Preliminary u/s report reviewed, 89.1th percentile, AC is in the 94.4 percentile. EFW: 2445g, 5lb 6oz AFI 19

## 2024-04-30 NOTE — Assessment & Plan Note (Signed)
 NSTs to begin at 37w. Growth u/s in 4w.

## 2024-04-30 NOTE — Assessment & Plan Note (Signed)
 Reviewed kick counts and preterm labor warning signs. Instructed to call office or come to hospital with persistent headache, vision changes, regular contractions, leaking of fluid, decreased fetal movement or vaginal bleeding.

## 2024-05-13 NOTE — Progress Notes (Unsigned)
    Return Prenatal Note   Subjective   23 y.o. G2P0101 at [redacted]w[redacted]d presents for this follow-up prenatal visit.  Patient has been having some pressure and contractions on and off. She reports good fetal movement. Patient reports: Movement: Present Contractions: Irritability  Objective   Flow sheet Vitals: Pulse Rate: 89 BP: 121/71 Fundal Height: 37 cm Fetal Heart Rate (bpm): 158 Total weight gain: 39 lb 6.4 oz (17.9 kg)  General Appearance  No acute distress, well appearing, and well nourished Pulmonary   Normal work of breathing Neurologic   Alert and oriented to person, place, and time Psychiatric   Mood and affect within normal limits   Assessment/Plan   Plan  23 y.o. G2P0101 at [redacted]w[redacted]d presents for follow-up OB visit. Reviewed prenatal record including previous visit note.  Supervision of high risk pregnancy, antepartum Feeling very big!  Having some heart burn. Cannot tolerate Tums. Will prescribe famotidine  Reviewed kick counts and preterm labor warning signs. Instructed to call office or come to hospital with persistent headache, vision changes, regular contractions, leaking of fluid, decreased fetal movement or vaginal bleeding.   Macrosomia affecting management of mother in third trimester, single gestation Growth US  scheduled for 12/2  Obesity in pregnancy Will begin NSTs at 37 weeks.     Future Appointments  Date Time Provider Department Center  05/28/2024 11:15 AM [facility] US  1 [facility] None  05/28/2024  3:55 PM Leigh Sober, MD [facility] None    For next visit:  ROB with GBS screening

## 2024-05-14 ENCOUNTER — Encounter: Payer: Self-pay | Admitting: Certified Nurse Midwife

## 2024-05-14 ENCOUNTER — Ambulatory Visit: Admitting: Certified Nurse Midwife

## 2024-05-14 VITALS — BP 121/71 | HR 89 | Wt 237.4 lb

## 2024-05-14 DIAGNOSIS — E669 Obesity, unspecified: Secondary | ICD-10-CM | POA: Diagnosis not present

## 2024-05-14 DIAGNOSIS — Z3A34 34 weeks gestation of pregnancy: Secondary | ICD-10-CM

## 2024-05-14 DIAGNOSIS — O99210 Obesity complicating pregnancy, unspecified trimester: Secondary | ICD-10-CM

## 2024-05-14 DIAGNOSIS — O99213 Obesity complicating pregnancy, third trimester: Secondary | ICD-10-CM | POA: Diagnosis not present

## 2024-05-14 DIAGNOSIS — O3663X0 Maternal care for excessive fetal growth, third trimester, not applicable or unspecified: Secondary | ICD-10-CM | POA: Diagnosis not present

## 2024-05-14 DIAGNOSIS — O0990 Supervision of high risk pregnancy, unspecified, unspecified trimester: Secondary | ICD-10-CM

## 2024-05-14 MED ORDER — FAMOTIDINE 20 MG PO TABS: 20.0000 mg | ORAL_TABLET | Freq: Two times a day (BID) | ORAL | 3 refills | Status: DC

## 2024-05-14 NOTE — Assessment & Plan Note (Signed)
 Feeling very big!  Having some heart burn. Cannot tolerate Tums. Will prescribe famotidine  Reviewed kick counts and preterm labor warning signs. Instructed to call office or come to hospital with persistent headache, vision changes, regular contractions, leaking of fluid, decreased fetal movement or vaginal bleeding.

## 2024-05-14 NOTE — Assessment & Plan Note (Signed)
 Will begin NSTs at 37 weeks.

## 2024-05-14 NOTE — Assessment & Plan Note (Signed)
 Growth US  scheduled for 12/2

## 2024-05-28 ENCOUNTER — Ambulatory Visit

## 2024-05-28 ENCOUNTER — Other Ambulatory Visit (HOSPITAL_COMMUNITY)
Admission: RE | Admit: 2024-05-28 | Discharge: 2024-05-28 | Disposition: A | Source: Ambulatory Visit | Attending: Obstetrics | Admitting: Obstetrics

## 2024-05-28 ENCOUNTER — Ambulatory Visit: Admitting: Obstetrics

## 2024-05-28 VITALS — BP 120/72 | HR 105 | Wt 242.0 lb

## 2024-05-28 DIAGNOSIS — O99213 Obesity complicating pregnancy, third trimester: Secondary | ICD-10-CM

## 2024-05-28 DIAGNOSIS — Z3689 Encounter for other specified antenatal screening: Secondary | ICD-10-CM

## 2024-05-28 DIAGNOSIS — Z3A36 36 weeks gestation of pregnancy: Secondary | ICD-10-CM | POA: Diagnosis not present

## 2024-05-28 DIAGNOSIS — Z3685 Encounter for antenatal screening for Streptococcus B: Secondary | ICD-10-CM

## 2024-05-28 DIAGNOSIS — Z113 Encounter for screening for infections with a predominantly sexual mode of transmission: Secondary | ICD-10-CM

## 2024-05-28 DIAGNOSIS — O09293 Supervision of pregnancy with other poor reproductive or obstetric history, third trimester: Secondary | ICD-10-CM | POA: Diagnosis not present

## 2024-05-28 DIAGNOSIS — O0993 Supervision of high risk pregnancy, unspecified, third trimester: Secondary | ICD-10-CM

## 2024-05-28 DIAGNOSIS — O34219 Maternal care for unspecified type scar from previous cesarean delivery: Secondary | ICD-10-CM

## 2024-05-28 DIAGNOSIS — O0990 Supervision of high risk pregnancy, unspecified, unspecified trimester: Secondary | ICD-10-CM

## 2024-05-28 DIAGNOSIS — O09299 Supervision of pregnancy with other poor reproductive or obstetric history, unspecified trimester: Secondary | ICD-10-CM

## 2024-05-28 DIAGNOSIS — E669 Obesity, unspecified: Secondary | ICD-10-CM

## 2024-05-28 DIAGNOSIS — O99210 Obesity complicating pregnancy, unspecified trimester: Secondary | ICD-10-CM

## 2024-05-28 NOTE — Progress Notes (Unsigned)
    Return Prenatal Note   Subjective  23 y.o. G2P0101 at [redacted]w[redacted]d presents for this follow-up prenatal visit.  Patient ***  Patient reports: Movement: Present Contractions: Irritability Denies vaginal bleeding or leaking fluid. Objective  Flow sheet Vitals: Pulse Rate: (!) 105 BP: 120/72 Total weight gain: 44 lb (20 kg)  General Appearance  No acute distress, well appearing, and well nourished Pulmonary   Normal work of breathing Neurologic   Alert and oriented to person, place, and time Psychiatric   Mood and affect within normal limits  Luvia Orzechowski 21-Dec-2000 [redacted]w[redacted]d  Fetus A Non-Stress Test Interpretation for 05/28/24  Indication: {MFM NST INDICATIONS:20869}  Fetal Heart Rate A Mode: External Baseline Rate (A): 135 bpm Variability: Moderate Accelerations: 10 x 10 Decelerations: None Multiple birth?: No  Uterine Activity Mode: Toco  Interpretation (Fetal Testing) Nonstress Test Interpretation: Reactive Overall Impression: Reassuring for gestational age    Assessment/Plan   Plan  23 y.o. G2P0101 at [redacted]w[redacted]d presents for follow-up OB visit. Reviewed prenatal record including previous visit note. 1. Antenatal screening for streptococcus B (Primary) - Culture, beta strep (group b only)  2. Routine screening for STI (sexually transmitted infection) - Cervicovaginal ancillary only  3. NST (non-stress test) reactive - Fetal nonstress test; Future   No problem-specific Assessment & Plan notes found for this encounter.    Orders Placed This Encounter  Procedures   Culture, beta strep (group b only)   Fetal nonstress test    Standing Status:   Future    Expiration Date:   05/28/2025   Return in about 1 week (around 06/04/2024) for ROB w/NST.   Future Appointments  Date Time Provider Department Center  06/03/2024  3:15 PM Starla Harland BROCKS, MD [facility] None   NST; reactive GBS, GCCT Labor precautions F?u 1 wk  For next visit:   {SJFprenatalcare:29716}      Estil Mangle, DO [facility]

## 2024-05-29 ENCOUNTER — Encounter: Admitting: Licensed Practical Nurse

## 2024-05-30 LAB — CERVICOVAGINAL ANCILLARY ONLY
Chlamydia: NEGATIVE
Comment: NEGATIVE
Comment: NORMAL
Neisseria Gonorrhea: NEGATIVE

## 2024-06-02 LAB — CULTURE, BETA STREP (GROUP B ONLY): Strep Gp B Culture: NEGATIVE

## 2024-06-03 ENCOUNTER — Encounter: Admitting: Obstetrics & Gynecology

## 2024-06-13 ENCOUNTER — Inpatient Hospital Stay: Admitting: Certified Registered"

## 2024-06-13 ENCOUNTER — Other Ambulatory Visit: Payer: Self-pay

## 2024-06-13 ENCOUNTER — Encounter: Admission: EM | Disposition: A | Payer: Self-pay | Attending: Registered Nurse

## 2024-06-13 ENCOUNTER — Encounter: Payer: Self-pay | Admitting: Obstetrics and Gynecology

## 2024-06-13 ENCOUNTER — Inpatient Hospital Stay: Admission: EM | Admit: 2024-06-13 | Admitting: Obstetrics and Gynecology

## 2024-06-13 DIAGNOSIS — O9952 Diseases of the respiratory system complicating childbirth: Secondary | ICD-10-CM | POA: Diagnosis present

## 2024-06-13 DIAGNOSIS — O3663X0 Maternal care for excessive fetal growth, third trimester, not applicable or unspecified: Secondary | ICD-10-CM | POA: Diagnosis present

## 2024-06-13 DIAGNOSIS — O99214 Obesity complicating childbirth: Secondary | ICD-10-CM | POA: Diagnosis present

## 2024-06-13 DIAGNOSIS — Z833 Family history of diabetes mellitus: Secondary | ICD-10-CM | POA: Diagnosis not present

## 2024-06-13 DIAGNOSIS — J45909 Unspecified asthma, uncomplicated: Secondary | ICD-10-CM | POA: Diagnosis present

## 2024-06-13 DIAGNOSIS — O0990 Supervision of high risk pregnancy, unspecified, unspecified trimester: Principal | ICD-10-CM

## 2024-06-13 DIAGNOSIS — Z7982 Long term (current) use of aspirin: Secondary | ICD-10-CM | POA: Diagnosis not present

## 2024-06-13 DIAGNOSIS — Z8249 Family history of ischemic heart disease and other diseases of the circulatory system: Secondary | ICD-10-CM

## 2024-06-13 DIAGNOSIS — O9962 Diseases of the digestive system complicating childbirth: Secondary | ICD-10-CM | POA: Diagnosis present

## 2024-06-13 DIAGNOSIS — Z91040 Latex allergy status: Secondary | ICD-10-CM | POA: Diagnosis not present

## 2024-06-13 DIAGNOSIS — O34211 Maternal care for low transverse scar from previous cesarean delivery: Principal | ICD-10-CM | POA: Diagnosis present

## 2024-06-13 DIAGNOSIS — K219 Gastro-esophageal reflux disease without esophagitis: Secondary | ICD-10-CM | POA: Diagnosis present

## 2024-06-13 DIAGNOSIS — E66813 Obesity, class 3: Secondary | ICD-10-CM | POA: Diagnosis present

## 2024-06-13 DIAGNOSIS — O09293 Supervision of pregnancy with other poor reproductive or obstetric history, third trimester: Secondary | ICD-10-CM

## 2024-06-13 DIAGNOSIS — Z3A39 39 weeks gestation of pregnancy: Secondary | ICD-10-CM

## 2024-06-13 DIAGNOSIS — O99210 Obesity complicating pregnancy, unspecified trimester: Secondary | ICD-10-CM | POA: Diagnosis present

## 2024-06-13 DIAGNOSIS — O34219 Maternal care for unspecified type scar from previous cesarean delivery: Secondary | ICD-10-CM | POA: Diagnosis present

## 2024-06-13 LAB — CBC
HCT: 38.7 % (ref 36.0–46.0)
Hemoglobin: 13.7 g/dL (ref 12.0–15.0)
MCH: 30.4 pg (ref 26.0–34.0)
MCHC: 35.4 g/dL (ref 30.0–36.0)
MCV: 85.8 fL (ref 80.0–100.0)
Platelets: 229 K/uL (ref 150–400)
RBC: 4.51 MIL/uL (ref 3.87–5.11)
RDW: 14.5 % (ref 11.5–15.5)
WBC: 10.5 K/uL (ref 4.0–10.5)
nRBC: 0 % (ref 0.0–0.2)

## 2024-06-13 LAB — TYPE AND SCREEN
ABO/RH(D): O POS
Antibody Screen: NEGATIVE

## 2024-06-13 SURGERY — Surgical Case
Anesthesia: Spinal

## 2024-06-13 MED ORDER — SODIUM CHLORIDE 0.9 % IV SOLN
INTRAVENOUS | Status: DC | PRN
  Administered 2024-06-13: 21:00:00 500 mg via INTRAVENOUS

## 2024-06-13 MED ORDER — FENTANYL CITRATE (PF) 100 MCG/2ML IJ SOLN
INTRAMUSCULAR | Status: DC | PRN
  Administered 2024-06-13: 20:00:00 15 ug via INTRATHECAL

## 2024-06-13 MED ORDER — OXYTOCIN-SODIUM CHLORIDE 30-0.9 UT/500ML-% IV SOLN: 2.5000 [IU]/h | INTRAVENOUS | Status: AC

## 2024-06-13 MED ORDER — ACETAMINOPHEN 325 MG PO TABS: 650.0000 mg | ORAL_TABLET | ORAL | Status: DC | PRN

## 2024-06-13 MED ORDER — PHENYLEPHRINE HCL-NACL 20-0.9 MG/250ML-% IV SOLN
INTRAVENOUS | Status: DC | PRN
  Administered 2024-06-13: 21:00:00 30 ug/min via INTRAVENOUS

## 2024-06-13 MED ORDER — ONDANSETRON HCL 4 MG/2ML IJ SOLN: 4.0000 mg | Freq: Four times a day (QID) | INTRAMUSCULAR | Status: DC

## 2024-06-13 MED ORDER — DEXAMETHASONE SOD PHOSPHATE PF 10 MG/ML IJ SOLN
INTRAMUSCULAR | Status: DC | PRN
  Administered 2024-06-13: 21:00:00 10 mg via INTRAVENOUS

## 2024-06-13 MED ORDER — SOD CITRATE-CITRIC ACID 500-334 MG/5ML PO SOLN
30.0000 mL | ORAL | Status: DC | PRN
  Administered 2024-06-13: 20:00:00 30 mL via ORAL

## 2024-06-13 MED ORDER — OXYTOCIN 10 UNIT/ML IJ SOLN: 10.0000 [IU] | Freq: Once | INTRAMUSCULAR | Status: DC

## 2024-06-13 MED ORDER — OXYCODONE-ACETAMINOPHEN 5-325 MG PO TABS: 2.0000 | ORAL_TABLET | ORAL | Status: DC | PRN

## 2024-06-13 MED ORDER — CEFAZOLIN SODIUM-DEXTROSE 2-3 GM-%(50ML) IV SOLR
INTRAVENOUS | Status: DC | PRN
  Administered 2024-06-13: 21:00:00 2 g via INTRAVENOUS

## 2024-06-13 MED ORDER — PHENYLEPHRINE HCL (PRESSORS) 10 MG/ML IV SOLN
INTRAVENOUS | Status: DC | PRN
  Administered 2024-06-13 (×2): 160 ug via INTRAVENOUS

## 2024-06-13 MED ORDER — MORPHINE SULFATE (PF) 0.5 MG/ML IJ SOLN
INTRAMUSCULAR | Status: AC
  Filled 2024-06-13: qty 10

## 2024-06-13 MED ORDER — MORPHINE SULFATE (PF) 0.5 MG/ML IJ SOLN
INTRAMUSCULAR | Status: DC | PRN
  Administered 2024-06-13: 20:00:00 .1 mg via INTRATHECAL

## 2024-06-13 MED ORDER — KETOROLAC TROMETHAMINE 30 MG/ML IJ SOLN
INTRAMUSCULAR | Status: DC | PRN
  Administered 2024-06-13: 22:00:00 30 mg via INTRAVENOUS

## 2024-06-13 MED ORDER — BUPIVACAINE IN DEXTROSE 0.75-8.25 % IT SOLN
INTRATHECAL | Status: DC | PRN
  Administered 2024-06-13: 20:00:00 1.5 mL via INTRATHECAL

## 2024-06-13 MED ORDER — OXYCODONE-ACETAMINOPHEN 5-325 MG PO TABS: 1.0000 | ORAL_TABLET | ORAL | Status: DC | PRN

## 2024-06-13 MED ORDER — CEFAZOLIN SODIUM-DEXTROSE 2-4 GM/100ML-% IV SOLN
INTRAVENOUS | Status: AC
  Filled 2024-06-13: qty 100

## 2024-06-13 MED ORDER — TERBUTALINE SULFATE 1 MG/ML IJ SOLN
INTRAMUSCULAR | Status: AC
  Filled 2024-06-13: qty 1

## 2024-06-13 MED ORDER — FENTANYL CITRATE (PF) 100 MCG/2ML IJ SOLN
100.0000 ug | INTRAMUSCULAR | Status: DC | PRN
  Administered 2024-06-13: 18:00:00 100 ug via INTRAVENOUS
  Filled 2024-06-13: qty 2

## 2024-06-13 MED ORDER — SOD CITRATE-CITRIC ACID 500-334 MG/5ML PO SOLN
ORAL | Status: AC
  Filled 2024-06-13: qty 15

## 2024-06-13 MED ORDER — FENTANYL CITRATE (PF) 100 MCG/2ML IJ SOLN
INTRAMUSCULAR | Status: AC
  Filled 2024-06-13: qty 2

## 2024-06-13 MED ORDER — LACTATED RINGERS IV SOLN: INTRAVENOUS | Status: DC

## 2024-06-13 MED ORDER — SODIUM CHLORIDE 0.9 % IV SOLN
INTRAVENOUS | Status: AC
  Filled 2024-06-13: qty 5

## 2024-06-13 MED ORDER — LACTATED RINGERS IV SOLN: 500.0000 mL | INTRAVENOUS | Status: DC | PRN

## 2024-06-13 MED ORDER — PHENYLEPHRINE HCL-NACL 20-0.9 MG/250ML-% IV SOLN
INTRAVENOUS | Status: AC
  Filled 2024-06-13: qty 250

## 2024-06-13 MED ORDER — ONDANSETRON HCL 4 MG/2ML IJ SOLN
INTRAMUSCULAR | Status: DC | PRN
  Administered 2024-06-13: 21:00:00 4 mg via INTRAVENOUS

## 2024-06-13 MED ADMIN — Oxytocin-Sodium Chloride 0.9% IV Soln 30 Unit/500ML: 30 [IU] | INTRAVENOUS | @ 21:00:00 | NDC 70092106807

## 2024-06-13 MED ADMIN — Terbutaline Sulfate Inj 1 MG/ML: 0.25 mg | SUBCUTANEOUS | @ 18:00:00 | NDC 00143974601

## 2024-06-13 MED FILL — Oxytocin-Sodium Chloride 0.9% IV Soln 30 Unit/500ML: 2.5000 [IU]/h | INTRAVENOUS | Qty: 1000 | Status: AC

## 2024-06-13 MED FILL — Ondansetron HCl Inj 4 MG/2ML (2 MG/ML): INTRAMUSCULAR | Qty: 2 | Status: AC

## 2024-06-13 SURGICAL SUPPLY — 28 items
BENZOIN TINCTURE PRP APPL 2/3 (GAUZE/BANDAGES/DRESSINGS) ×1 IMPLANT
CATH KIT ON-Q SILVERSOAK 5 (CATHETERS) ×2 IMPLANT
DERMABOND ADVANCED .7 DNX12 (GAUZE/BANDAGES/DRESSINGS) ×1 IMPLANT
DERMABOND ADVANCED .7 DNX6 (GAUZE/BANDAGES/DRESSINGS) IMPLANT
DRSG OPSITE POSTOP 4X10 (GAUZE/BANDAGES/DRESSINGS) ×1 IMPLANT
DRSG TEGADERM 4X4.75 (GAUZE/BANDAGES/DRESSINGS) IMPLANT
DRSG TELFA 3X8 NADH STRL (GAUZE/BANDAGES/DRESSINGS) ×1 IMPLANT
ELECT CAUTERY BLADE 6.4 (BLADE) ×1 IMPLANT
ELECTRODE REM PT RTRN 9FT ADLT (ELECTROSURGICAL) ×1 IMPLANT
GAUZE SPONGE 4X4 12PLY STRL (GAUZE/BANDAGES/DRESSINGS) ×1 IMPLANT
GLOVE BIO SURGEON STRL SZ7 (GLOVE) ×1 IMPLANT
GLOVE INDICATOR 7.5 STRL GRN (GLOVE) ×1 IMPLANT
GOWN STRL REUS W/ TWL LRG LVL3 (GOWN DISPOSABLE) ×3 IMPLANT
MANIFOLD NEPTUNE II (INSTRUMENTS) ×1 IMPLANT
MAT PREVALON FULL STRYKER (MISCELLANEOUS) ×1 IMPLANT
PACK C SECTION AR (MISCELLANEOUS) ×1 IMPLANT
PAD OB MATERNITY 11 LF (PERSONAL CARE ITEMS) ×2 IMPLANT
PAD PREP OB/GYN DISP 24X41 (PERSONAL CARE ITEMS) ×1 IMPLANT
SCRUB CHG 4% DYNA-HEX 4OZ (MISCELLANEOUS) ×1 IMPLANT
SOLN 0.9% NACL POUR BTL 1000ML (IV SOLUTION) ×1 IMPLANT
SOLN STERILE WATER 500 ML (IV SOLUTION) ×1 IMPLANT
STAPLER INSORB 30 2030 C-SECTI (MISCELLANEOUS) IMPLANT
STRIP CLOSURE SKIN 1/2X4 (GAUZE/BANDAGES/DRESSINGS) ×1 IMPLANT
SUT PDS AB 1 TP1 96 (SUTURE) ×1 IMPLANT
SUT VIC AB 0 CTX36XBRD ANBCTRL (SUTURE) ×2 IMPLANT
SUT VIC AB 3-0 SH 27X BRD (SUTURE) IMPLANT
SUTURE MNCRL 4-0 27XMF (SUTURE) ×1 IMPLANT
TRAP FLUID SMOKE EVACUATOR (MISCELLANEOUS) ×1 IMPLANT

## 2024-06-13 NOTE — H&P (Signed)
 [facility] Labor & Delivery  History and Physical  ASSESSMENT AND PLAN   Diane Watson is a 23 y.o. G2P0101 at [redacted]w[redacted]d with EDD: 06/20/2024, by Ultrasound admitted for contractions and request for repeat Cesarean .   Repeat Csection - Discussed with patient pain management options with vaginal delivery if desired - Patient requests repeat Csection and is aware of ability to manage labor pain if opted to proceed in labor -Cervical Exam: 3/90/-1, no bleeding, or leakage of fluid - Notified Dr. Leonce of patient request for repeat Csection, he is in agreement with plan and en route.  - Patient updated, offered IV fentanyl  for pain management until procedure   Fetal Status: - cephalic presentation by 38 week U/S EFW: 3437g, 7lb 9oz at 38 weeks - Continuous monitoring - FHT currently cat 1  Hx of Pre Eclampsia in prior pregnancy - normal Bps this pregnancy - Denies headache, right upper quadrant, vision changes on admission  Hx of Previous Csection - For arrest of dilation - Preterm deliver at [redacted]w[redacted]d  Asthma - Will avoid Hemabate    Labs/Immunizations: Prenatal labs and studies: ABO, Rh: O/Positive/-- (06/05 1630) Antibody: Negative (06/05 1630) Rubella: 8.31 (06/05 1630) Varicella: Non Reactive (06/05 1630)  RPR: Non Reactive (09/22 1004)  HBsAg: Negative (06/05 1630)  HepC: Non Reactive (06/05 1630) HIV: Non Reactive (09/22 1004)  GBS: Negative/-- (12/03 0803)   TDAP: 9/22 Flu: 10/7 RSV: yes, 11/4  Postpartum Plan: - Feeding: No data found - Contraception: plans undecided - Prenatal Care Provider: CNM     HPI   Chief Complaint: Contractions and request for repeat Csections  Diane Watson is a 23 y.o. G2P0101 at [redacted]w[redacted]d who presents with strong contractions   Pregnancy Complications Patient Active Problem List   Diagnosis Date Noted   Normal labor 06/13/2024   Asthma 04/30/2024   Macrosomia affecting management of mother in  third trimester, single gestation 04/30/2024   Obesity in pregnancy 04/02/2024   Uterine size date discrepancy 03/18/2024   History of pre-eclampsia in prior pregnancy, currently pregnant 12/28/2023   Supervision of high risk pregnancy, antepartum 10/23/2023   History of cesarean delivery, currently pregnant 08/08/2018   Migraine with aura and without status migrainosus, not intractable 03/20/2018    Review of Systems A twelve point review of systems was negative except as stated in HPI.   HISTORY   Medications Medications Prior to Admission  Medication Sig Dispense Refill Last Dose/Taking   aspirin  EC 81 MG tablet Take 1 tablet (81 mg total) by mouth daily. Take after 12 weeks for prevention of preeclampssia later in pregnancy 90 tablet 2 06/12/2024 Bedtime   famotidine  (PEPCID ) 20 MG tablet Take 1 tablet (20 mg total) by mouth 2 (two) times daily. 60 tablet 3 06/13/2024 Morning   ondansetron  (ZOFRAN ) 4 MG tablet Take 1 tablet (4 mg total) by mouth every 8 (eight) hours as needed for nausea or vomiting. 20 tablet 2 Past Month   Prenatal Vit-Fe Fumarate-FA (PRENATAL VITAMINS) 28-0.8 MG TABS Take 1 Units by mouth daily. 30 tablet 4 06/13/2024 Morning    Allergies is allergic to latex and pineapple.   OB History OB History  Gravida Para Term Preterm AB Living  2 1 0 1 0 1  SAB IAB Ectopic Multiple Live Births  0 0 0 0 1    # Outcome Date GA Lbr Len/2nd Weight Sex Type Anes PTL Lv  2 Current           1  Preterm 08/06/18 [redacted]w[redacted]d  2150 g M CS-LTranv Spinal, EPI  LIV     Name: Staron,BOY Courtnie     Apgar1: 8  Apgar5: 9    Past Medical History Past Medical History:  Diagnosis Date   Asthma    Encounter for supervision of normal pregnancy in teen primigravida, antepartum 03/27/2018   Clinic    [facility]    Prenatal Labs      Dating    17 w ultrasound    Blood type: O/Positive/-- (10/03 0959)       Genetic Screen    NIPS: Negative XY    Antibody:Negative (10/03 0959)       Anatomic US     Complete 05/09/2018    Rubella: 5.98 (10/03 0959) Varicella: Non-immune      GTT    Early: 119             Third trimester: 133    RPR: Non Reactive (10/03 0959)       Rhogam    N/A    HBsAg: Neg   Failure to progress in labor, delivered, current hospitalization 08/08/2018   Headache    Migraines since age 51   Hypertension, postpartum condition or complication 08/13/2018   Severe preeclampsia 08/05/2018    Past Surgical History Past Surgical History:  Procedure Laterality Date   ADENOIDECTOMY  12/09/2014   Procedure: ADENOIDECTOMY;  Surgeon: Deward Dolly, MD;  Location: [facility];  Service: ENT;;   CESAREAN SECTION N/A 08/06/2018   Procedure: CESAREAN SECTION;  Surgeon: Leonce Garnette BIRCH, MD;  Location: [facility];  Service: Obstetrics;  Laterality: N/A;   TONSILLECTOMY N/A 12/09/2014   Procedure: TONSILLECTOMY;  Surgeon: Deward Dolly, MD;  Location: [facility];  Service: ENT;  Laterality: N/A;   TONSILLECTOMY      Social History  reports that she has never smoked. She has never used smokeless tobacco. She reports that she does not drink alcohol and does not use drugs.   Family History family history includes Diabetes in her father; Hypertension in her father; Migraines in her paternal grandmother; Stroke in her father.   PHYSICAL EXAM   Vitals:   06/13/24 1637 06/13/24 1640  BP:  124/71  Pulse:  91  Resp:  18  Temp:  98.6 F (37 C)  TempSrc:  Oral  Weight: 108.9 kg   Height: 5' 2 (1.575 m)     Constitutional: No acute distress, well appearing, and well nourished. Neurologic: She is alert and conversational.  Psychiatric: She has a normal mood and affect.  Musculoskeletal: Normal gait, grossly normal range of motion Cardiovascular: Normal rate.   Pulmonary/Chest: Normal work of breathing.  Gastrointestinal/Abdominal: Soft. Gravid. There is no tenderness.  Skin: Skin is warm and dry. No rash noted.  Genitourinary: Normal external female  genitalia.  SVE:   Dilation: 3 Effacement (%): 90 Station: -1 Exam by:: Slaughterbeck CNM   NST Interpretation Indication: Contractions Baseline: 150 bpm Variability: moderate Accelerations: present Decelerations: none Contractions: 2-4  Time noted:  See OBIX Impression: reactive Authenticated by: Leita LILLETTE Camillia Adrien   Attending Emily Slaughterbeck, CNM was immediately available for the care of the patient.   Leita LILLETTE Camillia Adrien, Student-MidWife

## 2024-06-13 NOTE — Anesthesia Procedure Notes (Signed)
 Date/Time: 06/13/2024 8:50 PM  Performed by: Tod Handing, CRNAPre-anesthesia Checklist: Patient identified, Emergency Drugs available, Suction available and Patient being monitored Patient Re-evaluated:Patient Re-evaluated prior to induction Oxygen Delivery Method: Nasal cannula Dental Injury: Teeth and Oropharynx as per pre-operative assessment  Comments: Nasal cannula with etCO2 monitoring

## 2024-06-13 NOTE — Anesthesia Preprocedure Evaluation (Signed)
 Anesthesia Evaluation  Patient identified by MRN, date of birth, ID band Patient awake    Reviewed: Allergy & Precautions, H&P , NPO status , Patient's Chart, lab work & pertinent test results, reviewed documented beta blocker date and time   History of Anesthesia Complications Negative for: history of anesthetic complications  Airway Mallampati: IV  TM Distance: >3 FB Neck ROM: full    Dental   Pulmonary neg shortness of breath, asthma (as a child) , neg sleep apnea, neg COPD, neg recent URI   Pulmonary exam normal breath sounds clear to auscultation       Cardiovascular Exercise Tolerance: Good hypertension, (-) angina (-) Past MI and (-) Cardiac Stents negative cardio ROS Normal cardiovascular exam(-) dysrhythmias (-) Valvular Problems/Murmurs Rhythm:regular Rate:Normal     Neuro/Psych negative neurological ROS  negative psych ROS   GI/Hepatic Neg liver ROS,GERD  Medicated and Controlled,,  Endo/Other  neg diabetes  Class 3 obesity  Renal/GU negative Renal ROS  negative genitourinary   Musculoskeletal   Abdominal   Peds  Hematology negative hematology ROS (+)   Anesthesia Other Findings Past Medical History: No date: Asthma 03/27/2018: Encounter for supervision of normal pregnancy in teen  primigravida, antepartum     Comment:  Clinic    [facility]    Prenatal Labs      Dating    17 w               ultrasound    Blood type: O/Positive/-- (10/03 0959)                   Genetic Screen    NIPS: Negative XY    Antibody:Negative               (10/03 0959)      Anatomic US     Complete 05/09/2018                  Rubella: 5.98 (10/03 0959) Varicella: Non-immune      GTT              Early: 119             Third trimester: 133    RPR: Non               Reactive (10/03 0959)       Rhogam    N/A    HBsAg: Neg 08/08/2018: Failure to progress in labor, delivered, current  hospitalization No date: Headache     Comment:   Migraines since age 16 08/13/2018: Hypertension, postpartum condition or complication 08/05/2018: Severe preeclampsia   Reproductive/Obstetrics negative OB ROS                              Anesthesia Physical Anesthesia Plan  ASA: 3 and emergent  Anesthesia Plan: Spinal   Post-op Pain Management:    Induction:   PONV Risk Score and Plan:   Airway Management Planned: Natural Airway  Additional Equipment:   Intra-op Plan:   Post-operative Plan:   Informed Consent: I have reviewed the patients History and Physical, chart, labs and discussed the procedure including the risks, benefits and alternatives for the proposed anesthesia with the patient or authorized representative who has indicated his/her understanding and acceptance.     Dental Advisory Given  Plan Discussed with: Anesthesiologist, CRNA and Surgeon  Anesthesia Plan Comments:         Anesthesia Quick Evaluation

## 2024-06-13 NOTE — Progress Notes (Signed)
 Patient presented > 2 hours ago in labor. We had difficulty getting and IV and labs and this has delayed the timing of her surgery. To help reduce the risk of uterine rupture, which she is unwilling to take, this surgery should be considered an emergency (by this I mean it should go within 1 hour from the time of this note).   I have spoken with anesthesia and the nursing team about this timing need.  Garnette Mace, MD, [facility] 06/13/2024 7:00 PM

## 2024-06-13 NOTE — Transfer of Care (Signed)
 Immediate Anesthesia Transfer of Care Note  Patient: Diane Watson  Procedure(s) Performed: CESAREAN DELIVERY  Patient Location: Mother/Baby  Anesthesia Type:Spinal  Level of Consciousness: awake, alert , and oriented  Airway & Oxygen Therapy: Patient Spontanous Breathing  Post-op Assessment: Report given to RN, Post -op Vital signs reviewed and stable, and Patient moving all extremities  Post vital signs: Reviewed and stable  Last Vitals:  Vitals Value Taken Time  BP 119/65 06/13/24 21:58  Temp 36.4 C 06/13/24 21:58  Pulse    Resp 20 06/13/24 21:58  SpO2 95 % 06/13/24 21:58    Last Pain:  Vitals:   06/13/24 2158  TempSrc: Oral  PainSc: 0-No pain         Complications: No notable events documented.

## 2024-06-13 NOTE — Op Note (Signed)
 Cesarean Section Operative Note    Patient Name: Diane Watson  Date of Birth: 2001-06-10  MRN: [id number]  Date of Surgery: 06/13/2024   Pre-operative Diagnosis:  1) History of cesarean delivery, desires repeat 2) Normal labor 3) intrauterine pregnancy at [redacted]w[redacted]d   Post-operative Diagnosis:  1) History of cesarean delivery, desires repeat 2) Normal labor 3) intrauterine pregnancy at [redacted]w[redacted]d    Procedure: Repeat low transverse cesarean delivery via Pfannenstiel incision  Surgeon: Surgeons and Role:    DEWAINE Watson Diane JONETTA, MD - Primary   Assistants: Damien Parsley, CNM; No other capable assistant available, in surgery requiring high level assistant.  Anesthesia: spinal   Findings:  1) normal appearing gravid uterus, fallopian tubes, and ovaries 2) viable female infant with APGARs 8 and 9, weight 4,120 grams   Quantified Blood Loss: 565 mL  Total IV Fluids: 600 ml   Urine Output: 35 mL  Specimens: none  Complications: no complications  Disposition: PACU - hemodynamically stable.   Maternal Condition: stable   Baby condition / location:  Couplet care / Skin to Skin  Procedure Details:  The patient was seen in the Holding Room. The risks, benefits, complications, treatment options, and expected outcomes were discussed with the patient. The patient concurred with the proposed plan, giving informed consent. identified as Diane Watson and the procedure verified as C-Section Delivery. A Time Out was held and the above information confirmed.   After induction of anesthesia, the patient was prepped and draped in the usual sterile manner. A Pfannenstiel incision was made and carried down through the subcutaneous tissue to the fascia. Fascial incision was made and extended transversely. The fascia was separated from the underlying rectus tissue superiorly and inferiorly. The peritoneum was identified and entered. Peritoneal incision was extended longitudinally. The bladder  flap was not bluntly or sharply freed from the lower uterine segment. A low transverse uterine incision was made and the hysterotomy was extended with cranial-caudal tension. Delivered from cephalic presentation was a 4,120  gram Living newborn infant(s) or Female with Apgar scores of 8 at one minute and 9 at five minutes. Cord ph was not sent the umbilical cord was clamped and cut cord blood was obtained for evaluation. The placenta was removed Intact and appeared normal. The uterine outline, tubes and ovaries appeared normal. The uterine incision was closed with running locked sutures of 0 Vicryl.  A second layer of the same suture was thrown in an imbricating fashion.  Hemostasis was assured.  The uterus was returned to the abdomen and the paracolic gutters were cleared of all clots and debris.  The rectus muscles were inspected and found to be hemostatic.  The fascia was then reapproximated with running sutures of 1-0 PDS, looped. The subcutaneous tissue was reapproximated using 2-0 plain gut such that no greater than 2cm of dead space remained. The subcuticular closure was performed using 4-0 monocryl. The skin closure was reinforced using benzoin and 1/2 steri-strips.  The surgical assistant performed tissue retraction, assistance with suturing, and fundal pressure.  Instrument, sponge, and needle counts were correct prior the abdominal closure and were correct at the conclusion of the case.  The patient received Ancef  2 gram IV prior to skin incision (within 30 minutes). For VTE prophylaxis she was wearing SCDs throughout the case.  The assistant surgeon was an MD due to lack of availability of another sales promotion account executive.    Signed: Garnette CHARM Leonce, MD 06/13/2024 9:42 PM

## 2024-06-13 NOTE — OB Triage Note (Signed)
 Pt is a G2P1 at [redacted]w[redacted]d gestation reporting to L/D triage due to ctx. pt reports that the ctx started earlier this morning and increased in frequency/intensity around 1:30pm. She reports ctx that are now 2 minutes apart & reports feeling intermittent pressure and urge to urinate with these ctx. She rates pain with ctx 10/10.  Pt denies LOF, VB.  Pt endorses +FM,  Monitors applied and assessing with intitial fht 155 at 1630.  VSS - initial BP 124/71.  Damien CNM aware of pt arrival to unit

## 2024-06-13 NOTE — Progress Notes (Addendum)
 Diane Watson continues with frequent, painful contractions requiring IV pain medication for management. Terbutaline  given to try to slow down labor process. Consulted with Dr. Leonce and the patient needs to be sectioned within the hour because waiting will increase the maternal and fetal risks as she has had a previous c-section. IV team was called and IV placed due to difficulty of access. Awaiting labs.

## 2024-06-13 NOTE — Anesthesia Procedure Notes (Signed)
 Spinal  Patient location during procedure: OR Start time: 06/13/2024 8:21 PM End time: 06/13/2024 8:29 PM Reason for block: surgical anesthesia  Staffing Performed: resident/CRNA and anesthesiologist  Authorized by: Dario Barter, MD   Performed by: Tod Handing, CRNA  Preanesthetic Checklist Completed: patient identified, IV checked, site marked, risks and benefits discussed, surgical consent, monitors and equipment checked, pre-op evaluation and timeout performed Spinal Block Patient position: sitting Prep: ChloraPrep Patient monitoring: heart rate, continuous pulse ox, blood pressure and cardiac monitor Approach: midline Location: L4-5 Injection technique: single-shot Needle Needle type: Whitacre and Introducer  Needle gauge: 24 G Needle length: 9 cm Assessment Events: CSF return  Additional Notes Negative paresthesia. Negative blood return. Positive free-flowing CSF. Expiration date of kit checked and confirmed. Patient tolerated procedure well, without complications.

## 2024-06-13 NOTE — Discharge Summary (Shared)
 Postpartum Discharge Summary  Date of Service updated***     Patient Name: Diane Watson DOB: 08/28/2000 MRN: [id number]  Date of admission: 06/13/2024 Delivery date:06/13/2024 Delivering provider: JACKSON, STEPHEN D Date of discharge: 06/13/2024  Admitting diagnosis: Labor Intrauterine pregnancy: [redacted]w[redacted]d     Secondary diagnosis:  History of c/s Additional problems: none    Discharge diagnosis: Term Pregnancy Delivered                                              Post partum procedures:{Postpartum procedures:23558} Augmentation: N/A Complications: None  Hospital course: Onset of Labor With Unplanned C/S   23 y.o. yo [id number] at [redacted]w[redacted]d was admitted in Latent Labor on 06/13/2024. Upon admission, the patient requested a repeat C/S rather than TOLAC . The patient went for cesarean section due to Elective Repeat. Delivery details as follows: Membrane Rupture Time/Date: 8:57 PM,06/13/2024  Delivery Method:C-Section, Low Transverse Operative Delivery:N/A Details of operation can be found in separate operative note. Patient had a postpartum course complicated by***.  She is ambulating,tolerating a regular diet, passing flatus, and urinating well.  Patient is discharged home in stable condition 06/13/2024.  Newborn Data: Birth date:06/13/2024 Birth time:8:58 PM Gender:Female Living status:Living Apgars:8 ,9  Weight:4210 g  Magnesium  Sulfate received: No BMZ received: No Rhophylac:No MMR:No T-DaP:Given prenatally Flu: Given prenatally RSV Vaccine received: Yes Transfusion:{Transfusion received:30440034} Immunizations administered: Immunization History  Administered Date(s) Administered    sv, Bivalent, Protein Subunit Rsvpref,pf (Abrysvo) 04/30/2024   DTaP 03/02/2000, 05/04/2000, 07/13/2000, 01/12/2001   Dtap, Unspecified 11/15/2004   HIB, Unspecified 03/02/2000, 05/04/2000, 07/13/2000, 01/12/2001   HPV 9-valent 02/19/2015   HPV Quadrivalent 02/11/2011, 05/18/2011   Hep  B, Unspecified January 14, 2000, 03/02/2000, 07/13/2000   Hepatitis B, ADULT 2000/03/10   IPV 03/02/2000, 05/04/2000, 07/13/2000   Influenza, Seasonal, Injecte, Preservative Fre 05/10/2001, 06/11/2001, 05/07/2002, 05/05/2010, 04/02/2012   Influenza,inj,Quad PF,6+ Mos 04/05/2013, 03/18/2015   MMR 01/12/2001, 11/15/2004   Meningococcal Conjugate 02/11/2011   Novel Infuenza-h1n1-09 05/19/2008   PPD Test 08/27/2019   Pneumococcal Conjugate PCV 7 03/02/2000, 05/04/2000, 07/13/2000, 01/12/2001   Polio, Unspecified 11/15/2004   Tdap 11/15/2004, 02/11/2011, 06/22/2018, 03/18/2024   Varicella 01/12/2001    Physical exam  Vitals:   06/13/24 2216 06/13/24 2230 06/13/24 2245 06/13/24 2300  BP: 124/61 (!) 102/45 (!) 118/49 (!) 123/37  Pulse: 88 83 81 80  Resp:      Temp:      TempSrc:      SpO2: 94% 95% 94% 95%  Weight:      Height:       General: {Exam; general:21111117} Lochia: {Desc; appropriate/inappropriate:30686::appropriate} Uterine Fundus: {Desc; firm/soft:30687} Incision: {Exam; incision:21111123} DVT Evaluation: {Exam; dvt:2111122} Labs: Lab Results  Component Value Date   WBC 10.5 06/13/2024   HGB 13.7 06/13/2024   HCT 38.7 06/13/2024   MCV 85.8 06/13/2024   PLT 229 06/13/2024      Latest Ref Rng & Units 11/06/2023    5:37 PM  CMP  Total Protein 6.5 - 8.1 g/dL 6.7   Total Bilirubin 0.0 - 1.2 mg/dL 0.4   Alkaline Phos 38 - 126 U/L 49   AST 15 - 41 U/L 19   ALT 0 - 44 U/L 16    Edinburgh Score:    05/28/2024    4:06 PM  Edinburgh Postnatal Depression Scale Screening Tool  I have been able to laugh and  see the funny side of things. 0  I have looked forward with enjoyment to things. 0  I have blamed myself unnecessarily when things went wrong. 0  I have been anxious or worried for no good reason. 0  I have felt scared or panicky for no good reason. 0  Things have been getting on top of me. 0  I have been so unhappy that I have had difficulty sleeping. 0  I have felt  sad or miserable. 0  I have been so unhappy that I have been crying. 0  The thought of harming myself has occurred to me. 0  Edinburgh Postnatal Depression Scale Total 0      After visit meds:  Allergies as of 06/13/2024       Reactions   Latex    Pineapple    Numbness in mouth      Med Rec must be completed prior to using this [facility]***        Discharge home in stable condition Infant Feeding: {Baby feeding:23562} Infant Disposition:{CHL IP OB HOME WITH FNUYZM:76418} Discharge instruction: per After Visit Summary and Postpartum booklet. Activity: Advance as tolerated. Pelvic rest for 6 weeks.  Diet: {OB diet:21111121} Anticipated Birth Control: {Birth Control:23956} Postpartum Appointment:{Outpatient follow up:23559} Additional Postpartum F/U: {PP Procedure:23957} Future Appointments:No future appointments. Follow up Visit:  Follow-up Information     Scotti Kosta, Damien, CNM Follow up.   Specialty: Certified Nurse Midwife Why: two week incision check six week mood Contact information: [address] [place] [address] [phone]                     06/13/2024 Damien PARSLEY, CNM

## 2024-06-14 ENCOUNTER — Encounter: Payer: Self-pay | Admitting: Obstetrics and Gynecology

## 2024-06-14 LAB — CBC
HCT: 32.8 % — ABNORMAL LOW (ref 36.0–46.0)
Hemoglobin: 11.3 g/dL — ABNORMAL LOW (ref 12.0–15.0)
MCH: 30.8 pg (ref 26.0–34.0)
MCHC: 34.5 g/dL (ref 30.0–36.0)
MCV: 89.4 fL (ref 80.0–100.0)
Platelets: 198 K/uL (ref 150–400)
RBC: 3.67 MIL/uL — ABNORMAL LOW (ref 3.87–5.11)
RDW: 14.7 % (ref 11.5–15.5)
WBC: 12.3 K/uL — ABNORMAL HIGH (ref 4.0–10.5)
nRBC: 0 % (ref 0.0–0.2)

## 2024-06-14 LAB — SYPHILIS: RPR W/REFLEX TO RPR TITER AND TREPONEMAL ANTIBODIES, TRADITIONAL SCREENING AND DIAGNOSIS ALGORITHM: RPR Ser Ql: NONREACTIVE

## 2024-06-14 MED ORDER — FERROUS SULFATE 325 (65 FE) MG PO TABS
325.0000 mg | ORAL_TABLET | Freq: Two times a day (BID) | ORAL | Status: DC
  Administered 2024-06-14 – 2024-06-15 (×3): 325 mg via ORAL
  Filled 2024-06-14 (×3): qty 1

## 2024-06-14 MED ORDER — PRENATAL MULTIVITAMIN CH
1.0000 | ORAL_TABLET | Freq: Every day | ORAL | Status: DC
  Administered 2024-06-14 – 2024-06-15 (×2): 1 via ORAL
  Filled 2024-06-14 (×2): qty 1

## 2024-06-14 MED ORDER — KETOROLAC TROMETHAMINE 30 MG/ML IJ SOLN: 30.0000 mg | Freq: Four times a day (QID) | INTRAMUSCULAR | Status: AC | PRN

## 2024-06-14 MED ORDER — OXYCODONE-ACETAMINOPHEN 5-325 MG PO TABS
1.0000 | ORAL_TABLET | Freq: Four times a day (QID) | ORAL | Status: DC | PRN
  Administered 2024-06-15: 1 via ORAL
  Filled 2024-06-14: qty 1

## 2024-06-14 MED ORDER — OXYCODONE HCL 5 MG PO TABS: 5.0000 mg | ORAL_TABLET | Freq: Four times a day (QID) | ORAL | Status: DC | PRN

## 2024-06-14 MED ORDER — DIPHENHYDRAMINE HCL 25 MG PO CAPS: 25.0000 mg | ORAL_CAPSULE | ORAL | Status: DC | PRN

## 2024-06-14 MED ORDER — SIMETHICONE 80 MG PO CHEW
80.0000 mg | CHEWABLE_TABLET | Freq: Three times a day (TID) | ORAL | Status: DC
  Administered 2024-06-14 – 2024-06-15 (×5): 80 mg via ORAL
  Filled 2024-06-14 (×5): qty 1

## 2024-06-14 MED ORDER — DIPHENHYDRAMINE HCL 50 MG/ML IJ SOLN: 12.5000 mg | INTRAMUSCULAR | Status: DC | PRN

## 2024-06-14 MED ORDER — MENTHOL 3 MG MT LOZG: 1.0000 | LOZENGE | OROMUCOSAL | Status: DC | PRN

## 2024-06-14 MED ORDER — WITCH HAZEL-GLYCERIN EX PADS
1.0000 | MEDICATED_PAD | CUTANEOUS | Status: DC | PRN
  Filled 2024-06-14: qty 100

## 2024-06-14 MED ORDER — KETOROLAC TROMETHAMINE 30 MG/ML IJ SOLN
30.0000 mg | Freq: Four times a day (QID) | INTRAMUSCULAR | Status: AC | PRN
  Administered 2024-06-14 (×2): 30 mg via INTRAVENOUS
  Filled 2024-06-14 (×2): qty 1

## 2024-06-14 MED ORDER — DIBUCAINE (PERIANAL) 1 % EX OINT
1.0000 | TOPICAL_OINTMENT | CUTANEOUS | Status: DC | PRN
  Filled 2024-06-14: qty 28

## 2024-06-14 MED ORDER — NALOXONE HCL 0.4 MG/ML IJ SOLN: 0.4000 mg | INTRAMUSCULAR | Status: DC | PRN

## 2024-06-14 MED ORDER — SENNOSIDES-DOCUSATE SODIUM 8.6-50 MG PO TABS
2.0000 | ORAL_TABLET | ORAL | Status: DC
  Administered 2024-06-14: 2 via ORAL
  Filled 2024-06-14 (×2): qty 2

## 2024-06-14 MED ORDER — OXYCODONE-ACETAMINOPHEN 5-325 MG PO TABS: 2.0000 | ORAL_TABLET | Freq: Four times a day (QID) | ORAL | Status: DC | PRN

## 2024-06-14 MED ORDER — ONDANSETRON HCL 4 MG/2ML IJ SOLN: 4.0000 mg | Freq: Three times a day (TID) | INTRAMUSCULAR | Status: DC | PRN

## 2024-06-14 MED ORDER — IBUPROFEN 600 MG PO TABS
600.0000 mg | ORAL_TABLET | Freq: Four times a day (QID) | ORAL | Status: DC
  Administered 2024-06-14 – 2024-06-15 (×3): 600 mg via ORAL
  Filled 2024-06-14 (×4): qty 1

## 2024-06-14 MED ORDER — DIPHENHYDRAMINE HCL 25 MG PO CAPS: 25.0000 mg | ORAL_CAPSULE | Freq: Four times a day (QID) | ORAL | Status: DC | PRN

## 2024-06-14 MED ORDER — KETOROLAC TROMETHAMINE 30 MG/ML IJ SOLN: 30.0000 mg | Freq: Four times a day (QID) | INTRAMUSCULAR | Status: DC

## 2024-06-14 MED ORDER — NALOXONE HCL 4 MG/10ML IJ SOLN: 1.0000 ug/kg/h | INTRAVENOUS | Status: DC | PRN

## 2024-06-14 MED ORDER — COCONUT OIL OIL: 1.0000 | TOPICAL_OIL | Status: DC | PRN

## 2024-06-14 MED ORDER — SODIUM CHLORIDE 0.9% FLUSH: 3.0000 mL | INTRAVENOUS | Status: DC | PRN

## 2024-06-14 NOTE — Anesthesia Post-op Follow-up Note (Signed)
" °  Anesthesia Pain Follow-up Note  Patient: Diane Watson  Day #: 1  Date of Follow-up: 06/14/2024 Time: 7:19 AM  Last Vitals:  Vitals:   06/14/24 0215 06/14/24 0344  BP: (!) 113/54 (!) 118/56  Pulse: 80 71  Resp:  18  Temp:  36.4 C  SpO2: 96% 95%    Level of Consciousness: alert  Pain: mild   Side Effects:None  Catheter Site Exam:clean     Plan: D/C from anesthesia care at surgeon's request  Jaylene Delon HERO     "

## 2024-06-14 NOTE — Anesthesia Postprocedure Evaluation (Signed)
"   Anesthesia Post Note  Patient: Diane Watson  Procedure(s) Performed: CESAREAN DELIVERY  Patient location during evaluation: Mother Baby Anesthesia Type: Spinal Level of consciousness: oriented and awake and alert Pain management: pain level controlled Vital Signs Assessment: post-procedure vital signs reviewed and stable Respiratory status: spontaneous breathing and respiratory function stable Cardiovascular status: blood pressure returned to baseline and stable Postop Assessment: no headache, no backache, no apparent nausea or vomiting and able to ambulate Anesthetic complications: no   No notable events documented.   Last Vitals:  Vitals:   06/14/24 0215 06/14/24 0344  BP: (!) 113/54 (!) 118/56  Pulse: 80 71  Resp:  18  Temp:  36.4 C  SpO2: 96% 95%    Last Pain:  Vitals:   06/14/24 0345  TempSrc:   PainSc: 0-No pain                 Jaylene Delon HERO      "

## 2024-06-14 NOTE — Progress Notes (Signed)
 Obstetric Postpartum/PostOperative Daily Progress Note Subjective:  23 y.o. H7E8897 post-operative day # 1 status post repeat cesarean delivery.  She is ambulating, is tolerating po, is voiding spontaneously.  Her pain is well controlled on PO pain medications. Her lochia is less than menses.   Medications SCHEDULED MEDICATIONS   ferrous sulfate   325 mg Oral BID WC   ibuprofen   600 mg Oral Q6H   prenatal multivitamin  1 tablet Oral Q1200   senna-docusate  2 tablet Oral Q24H   simethicone   80 mg Oral TID PC    MEDICATION INFUSIONS   lactated ringers  125 mL/hr at 06/13/24 2208   naloxone  HCl (NARCAN ) 2 mg in dextrose  5 % 250 mL infusion      PRN MEDICATIONS  coconut oil, witch hazel-glycerin  **AND** dibucaine, diphenhydrAMINE  **OR** diphenhydrAMINE , ketorolac  **OR** ketorolac , menthol , naloxone  **AND** sodium chloride  flush, naloxone  HCl (NARCAN ) 2 mg in dextrose  5 % 250 mL infusion, ondansetron  (ZOFRAN ) IV, oxyCODONE -acetaminophen  **OR** oxyCODONE -acetaminophen     Objective:   Vitals:   06/14/24 0344 06/14/24 0753 06/14/24 1129 06/14/24 1516  BP: (!) 118/56 (!) 126/58 121/77 133/89  Pulse: 71 70 75 79  Resp: 18 20 19 20   Temp: 97.6 F (36.4 C) 97.6 F (36.4 C) 97.7 F (36.5 C) 98.2 F (36.8 C)  TempSrc: Oral Oral Oral Oral  SpO2: 95% 100%    Weight:      Height:        Current Vital Signs 24h Vital Sign Ranges  T 98.2 F (36.8 C) Temp  Avg: 97.8 F (36.6 C)  Min: 97.5 F (36.4 C)  Max: 98.4 F (36.9 C)  BP 133/89 BP  Min: 102/45  Max: 133/89  HR 79 Pulse  Avg: 80.5  Min: 70  Max: 99  RR 20 Resp  Avg: 19.2  Min: 18  Max: 20  SaO2 100 % Room Air SpO2  Avg: 95.7 %  Min: 94 %  Max: 100 %       24 Hour I/O Current Shift I/O  Time Ins Outs 12/18 0701 - 12/19 0700 In: 2396.6 [P.O.:200; I.V.:1946.6] Out: 1090 [Urine:435] 12/19 0701 - 12/19 1900 In: -  Out: 475 [Urine:475]  General: NAD Pulmonary: no increased work of breathing Abdomen: non-distended, non-tender,  fundus firm at level of umbilicus Inc: Clean/dry/intact Extremities: no edema, no erythema, no tenderness  Labs:  Recent Labs  Lab 06/13/24 1707 06/14/24 0559  WBC 10.5 12.3*  HGB 13.7 11.3*  HCT 38.7 32.8*  PLT 229 198     Assessment:   23 y.o. G2P1102 postoperative day # 1 status post repeat cesarean section  Plan:  1) Acute blood loss anemia - hemodynamically stable and asymptomatic - po ferrous sulfate   2) O POS / Rubella 8.31 (06/05 1630)/ Varicella Not immune  3) TDAP status UTD  4) bottle /Contraception = condoms  5) Disposition: anticipate discharge home POD 2-3.  Lauraine Lakes, CNM

## 2024-06-15 MED ORDER — OXYCODONE-ACETAMINOPHEN 5-325 MG PO TABS: 1.0000 | ORAL_TABLET | Freq: Four times a day (QID) | ORAL | 0 refills | Status: DC | PRN

## 2024-06-15 NOTE — Progress Notes (Signed)
 Discharge instructions reviewed with patient and significant other.  Questions answered and follow up care reviewed.  Printed copies given to patient for reference after discharge home.

## 2024-06-15 NOTE — Lactation Note (Addendum)
 This note was copied from a baby's chart. Lactation Consultation Note  Patient Name: Diane Watson Unijb'd Date: 06/15/2024 Age:24 hours Reason for consult: Follow-up assessment;Term;Maternal discharge Mom has been formula feeding baby exclusively, now is expressing a desire to breastfeed/pump breasts  Maternal Data Does the patient have breastfeeding experience prior to this delivery?: Yes How long did the patient breastfeed?: pumped a few days Baby in NICU, mom overwhelmed  Feeding Mother's Current Feeding Choice: Breast Milk and Formula Baby had recently had formula, awaiting circumcision, offered breastfeeding assistance when baby returns from circ or mom can begin pumping her breasts when she goes home, she has a manual pump and an electric pump, recommended use of electric pump to initiate milk supply.  Mom plans on being discharged today, she will consider her options and ask for assist as needed.    LATCH Score                    Lactation Tools Discussed/Used    Interventions Interventions: Breast feeding basics reviewed;Education Reviewed how milk is made, frequency of breastfeeding and answered mom's questions   LC name and no written on white board Discharge Discharge Education: Engorgement and breast care Pump: Personal WIC Program: Yes  Consult Status Consult Status: PRN    Diane Watson 06/15/2024, 11:19 AM

## 2024-06-17 ENCOUNTER — Telehealth: Payer: Self-pay | Admitting: Certified Nurse Midwife

## 2024-06-17 NOTE — Telephone Encounter (Signed)
 Contacted the patient via phone to scheduled 1 week Incision check , 2 week PP and 6 week PP. One week incision check I was going to offer 1:15 with Harlene Cisco for 12/26. Ok per [employer]. I left message for the patient to contact our office for scheduling.

## 2024-06-24 ENCOUNTER — Ambulatory Visit: Admitting: Obstetrics

## 2024-06-24 NOTE — Progress Notes (Signed)
"  ° °  Postoperative Cesarean Incision Check Diane Watson is a 23 y.o. H7E8897 s/p RLTCS at [redacted]w[redacted]d for History of cesarean delivery, desires repeat , POD#11, here today for incision check.  Subjective: Pain is controlled without any medications. She denies fever, chills, nausea, and vomiting. Eating a regular diet without difficulty.  Is having regular bowel movements. Activity: normal activities of daily living. Bleeding is WNL. Mood is stable. She denies issues with her incision.    Objective: BP 129/71   Pulse 73   Ht 5' 2 (1.575 m)   Wt 223 lb (101.2 kg)   Breastfeeding Yes   BMI 40.79 kg/m  Body mass index is 40.79 kg/m.  General:  alert and no distress  Abdomen: soft,  non-tender  Incision:   healing well, no drainage, no erythema, no seroma, no swelling, no dehiscence, incision well approximated    Assessment/Plan: Diane Watson is a 23 y.o. H7E8897 s/p RLTCS POD#11, here today for incision check, healing well. No concerns with incision today. Honeycomb dressing removed-Discussed at-home care, healing expectations, s/s of infection.  -Call clinic if developing redness, discharge, or increasing pain. -Avoid vigorous scrubbing/washing of incision site; hygiene reviewed. -May resume driving and light walking. Still no heavy lifting >10-12 lbs and pelvic rest advised until cleared at 6wk postpartum visit.   6 week visit 07/25/24   Diane Watson CNM [facility]  "

## 2024-07-05 ENCOUNTER — Telehealth: Admitting: Obstetrics

## 2024-07-05 NOTE — Progress Notes (Unsigned)
"   ° °  Virtual Visit via Video Note   I connected with Diane Watson  on 07/05/2024 1:53 PM by a video enabled telemedicine application and verified that I am speaking with the correct person using two identifiers.   Location: Patient: *** Provider: [facility]   I discussed the limitations of evaluation and management by telemedicine and the availability of in person appointments. The patient expressed understanding and agreed to proceed. Subjective:    Subjective  Diane Watson is a 24 y.o. female who presents for a postpartum visit. She is{1-10:13787} {time; units:18646}  postpartum following a {delivery:12449}. I have fully reviewed the prenatal and intrapartum course. The delivery was at *** .  Anesthesia: {anesthesia types:812}.    Postpartum course has been normal. She has no pain and no other concerns. Baby's course has been normal. Baby is feeding by  {breast/bottle:69}. Bleeding {vag bleed:12292}. Bowel function is {normal:32111}. Bladder function is {normal:32111}.  .  Contraception method: {contraceptive method:5051}. Postpartum depression screening:      Review of Systems Pertinent positives noted in HPI. Remainder of comprehensive ROS otherwise negative.     Objective:      Objective [] Expand by Default There were no vitals taken for this visit.  General:  alert, cooperative, and no distress        Assessment:    Normal postpartum course, mood stable.   Plan:      Plan -Anticipatory guidance about the postpartum period -Reviewed s/s of PPD and PPA -Discussed when to seek medical care -Office visit at 6 weeks postpartum  I discussed the assessment and treatment plan with the patient. The patient was provided an opportunity to ask questions and all were answered. The patient agreed with the plan and demonstrated an understanding of the instructions.   The patient was advised to call back or seek an in-person evaluation if the symptoms worsen or if the  condition fails to improve as anticipated.   I provided *** minutes of non-face-to-face time during this encounter.     Harlene Gander, CMA   "

## 2024-07-25 ENCOUNTER — Ambulatory Visit: Admitting: Certified Nurse Midwife

## 2024-07-25 ENCOUNTER — Other Ambulatory Visit (HOSPITAL_COMMUNITY)
Admission: RE | Admit: 2024-07-25 | Discharge: 2024-07-25 | Disposition: A | Source: Ambulatory Visit | Attending: Certified Nurse Midwife | Admitting: Certified Nurse Midwife

## 2024-07-25 DIAGNOSIS — Z124 Encounter for screening for malignant neoplasm of cervix: Secondary | ICD-10-CM | POA: Insufficient documentation

## 2024-07-25 MED ORDER — NORGESTIMATE-ETH ESTRADIOL 0.25-35 MG-MCG PO TABS: 1.0000 | ORAL_TABLET | Freq: Every day | ORAL | 4 refills | Status: AC

## 2024-07-25 NOTE — Patient Instructions (Signed)

## 2024-07-25 NOTE — Progress Notes (Signed)
" ° °  OBSTETRICS POSTPARTUM CLINIC PROGRESS NOTE  Subjective:     Diane Watson is a 24 y.o. G75P1102 female who presents for a postpartum visit. She is 6 weeks postpartum following a low cervical transverse Cesarean section. I have fully reviewed the prenatal and intrapartum course. The delivery was at 39 gestational weeks.  Anesthesia: spinal. Postpartum course has been going well. Baby's course has been good. Baby is feeding by both breast and bottle - Similac 360 Total Care. Bleeding: patient has not resumed menses, with No LMP recorded.. Bowel function is normal. Bladder function is normal. Patient is not sexually active. Contraception method desired is OCP (estrogen/progesterone). Postpartum depression screening: negative.  EDPS score is 0.    The following portions of the patient's history were reviewed and updated as appropriate: allergies, current medications, past family history, past medical history, past social history, past surgical history, and problem list.  Review of Systems Pertinent items noted in HPI and remainder of comprehensive ROS otherwise negative.   Objective:    BP 133/74   Pulse 87   Resp 16   Ht 5' 2 (1.575 m)   Wt 221 lb (100.2 kg)   Breastfeeding Yes   BMI 40.42 kg/m   General:  alert and no distress   Breasts:  inspection negative, no nipple discharge or bleeding, no masses or nodularity palpable  Lungs: clear to auscultation bilaterally  Heart:  regular rate and rhythm, S1, S2 normal, no murmur, click, rub or gallop  Abdomen: soft, non-tender; bowel sounds normal; no masses,  no organomegaly.  Well healed Pfannenstiel incision   Vulva:  normal  Vagina: normal vagina, no discharge, exudate, lesion, or erythema  Cervix:  no cervical motion tenderness and no lesions  Corpus: normal size, contour, position, consistency, mobility, non-tender  Adnexa:  normal adnexa and no mass, fullness, tenderness  Rectal Exam: Not performed.         Labs:  Lab  Results  Component Value Date   HGB 11.3 (L) 06/14/2024     Assessment:   1. Postpartum care following cesarean delivery   2. Cervical cancer screening      Plan:    1. Contraception: OCP (estrogen/progesterone) 2. Will check Hgb for h/o postpartum anemia of less than 10.  3. Follow up in: 1 year for Physical or as needed.   Pap collected today.    Damien Parsley, CNM [facility]

## 2024-07-29 LAB — CYTOLOGY - PAP: Diagnosis: NEGATIVE

## 2024-07-31 ENCOUNTER — Ambulatory Visit: Payer: Self-pay | Admitting: Certified Nurse Midwife
